# Patient Record
Sex: Male | Born: 1965 | Race: White | Hispanic: No | Marital: Married | Smoking: Never smoker
Health system: Southern US, Community
[De-identification: ages and names within clinical notes are randomized; demographics above are authoritative.]

## PROBLEM LIST (undated history)

## (undated) DIAGNOSIS — M1990 Unspecified osteoarthritis, unspecified site: Secondary | ICD-10-CM

## (undated) DIAGNOSIS — F32A Depression, unspecified: Secondary | ICD-10-CM

## (undated) HISTORY — PX: CERVICAL FUSION: SHX112

## (undated) HISTORY — PX: KNEE ARTHROSCOPY: SUR90

## (undated) HISTORY — PX: COLONOSCOPY W/ POLYPECTOMY: SHX1380

---

## 2020-06-22 ENCOUNTER — Emergency Department (HOSPITAL_COMMUNITY): Payer: Commercial Managed Care - PPO

## 2020-06-22 ENCOUNTER — Other Ambulatory Visit: Payer: Self-pay

## 2020-06-22 ENCOUNTER — Inpatient Hospital Stay (HOSPITAL_COMMUNITY)
Admission: EM | Admit: 2020-06-22 | Discharge: 2020-06-23 | DRG: 054 | Disposition: A | Discharge status: Home / Self Care | Payer: Commercial Managed Care - PPO | Attending: Neurosurgery | Admitting: Neurosurgery

## 2020-06-22 ENCOUNTER — Encounter (HOSPITAL_COMMUNITY): Payer: Self-pay | Admitting: Pediatrics

## 2020-06-22 DIAGNOSIS — R1310 Dysphagia, unspecified: Secondary | ICD-10-CM | POA: Diagnosis present

## 2020-06-22 DIAGNOSIS — Z20822 Contact with and (suspected) exposure to covid-19: Secondary | ICD-10-CM | POA: Diagnosis present

## 2020-06-22 DIAGNOSIS — R4702 Dysphasia: Secondary | ICD-10-CM | POA: Diagnosis present

## 2020-06-22 DIAGNOSIS — G9389 Other specified disorders of brain: Secondary | ICD-10-CM

## 2020-06-22 DIAGNOSIS — Z1389 Encounter for screening for other disorder: Secondary | ICD-10-CM

## 2020-06-22 DIAGNOSIS — R4701 Aphasia: Secondary | ICD-10-CM | POA: Diagnosis present

## 2020-06-22 DIAGNOSIS — D496 Neoplasm of unspecified behavior of brain: Principal | ICD-10-CM | POA: Diagnosis present

## 2020-06-22 DIAGNOSIS — C711 Malignant neoplasm of frontal lobe: Secondary | ICD-10-CM | POA: Diagnosis present

## 2020-06-22 DIAGNOSIS — G936 Cerebral edema: Secondary | ICD-10-CM | POA: Diagnosis present

## 2020-06-22 DIAGNOSIS — Z0189 Encounter for other specified special examinations: Secondary | ICD-10-CM

## 2020-06-22 LAB — URINALYSIS, ROUTINE W REFLEX MICROSCOPIC
Bilirubin Urine: NEGATIVE
Glucose, UA: NEGATIVE mg/dL
Hgb urine dipstick: NEGATIVE
Ketones, ur: NEGATIVE mg/dL
Leukocytes,Ua: NEGATIVE
Nitrite: NEGATIVE
Protein, ur: NEGATIVE mg/dL
Specific Gravity, Urine: 1.016 (ref 1.005–1.030)
pH: 7 (ref 5.0–8.0)

## 2020-06-22 LAB — COMPREHENSIVE METABOLIC PANEL
ALT: 19 U/L (ref 0–44)
AST: 19 U/L (ref 15–41)
Albumin: 4 g/dL (ref 3.5–5.0)
Alkaline Phosphatase: 67 U/L (ref 38–126)
Anion gap: 6 (ref 5–15)
BUN: 8 mg/dL (ref 6–20)
CO2: 27 mmol/L (ref 22–32)
Calcium: 10.3 mg/dL (ref 8.9–10.3)
Chloride: 109 mmol/L (ref 98–111)
Creatinine, Ser: 1 mg/dL (ref 0.61–1.24)
GFR calc Af Amer: >60 mL/min (ref >60)
GFR calc non Af Amer: >60 mL/min (ref >60)
Glucose, Bld: 104 mg/dL — ABNORMAL HIGH (ref 70–99)
Potassium: 4.2 mmol/L (ref 3.5–5.1)
Sodium: 142 mmol/L (ref 135–145)
Total Bilirubin: 0.7 mg/dL (ref 0.3–1.2)
Total Protein: 6.9 g/dL (ref 6.5–8.1)

## 2020-06-22 LAB — BASIC METABOLIC PANEL
Anion gap: 9 (ref 5–15)
BUN: 10 mg/dL (ref 6–20)
CO2: 22 mmol/L (ref 22–32)
Calcium: 10.2 mg/dL (ref 8.9–10.3)
Chloride: 106 mmol/L (ref 98–111)
Creatinine, Ser: 0.88 mg/dL (ref 0.61–1.24)
GFR calc Af Amer: >60 mL/min (ref >60)
GFR calc non Af Amer: >60 mL/min (ref >60)
Glucose, Bld: 186 mg/dL — ABNORMAL HIGH (ref 70–99)
Potassium: 5.1 mmol/L (ref 3.5–5.1)
Sodium: 137 mmol/L (ref 135–145)

## 2020-06-22 LAB — PROTIME-INR
INR: 1 (ref 0.8–1.2)
INR: 1 (ref 0.8–1.2)
Prothrombin Time: 12.3 seconds (ref 11.4–15.2)
Prothrombin Time: 12.8 seconds (ref 11.4–15.2)

## 2020-06-22 LAB — DIFFERENTIAL
Abs Immature Granulocytes: 0.03 10*3/uL (ref 0.00–0.07)
Basophils Absolute: 0 10*3/uL (ref 0.0–0.1)
Basophils Relative: 0 %
Eosinophils Absolute: 0.1 10*3/uL (ref 0.0–0.5)
Eosinophils Relative: 1 %
Immature Granulocytes: 0 %
Lymphocytes Relative: 27 %
Lymphs Abs: 2.3 10*3/uL (ref 0.7–4.0)
Monocytes Absolute: 0.7 10*3/uL (ref 0.1–1.0)
Monocytes Relative: 9 %
Neutro Abs: 5.2 10*3/uL (ref 1.7–7.7)
Neutrophils Relative %: 63 %

## 2020-06-22 LAB — RAPID URINE DRUG SCREEN, HOSP PERFORMED
Amphetamines: NOT DETECTED
Barbiturates: NOT DETECTED
Benzodiazepines: NOT DETECTED
Cocaine: NOT DETECTED
Opiates: NOT DETECTED
Tetrahydrocannabinol: NOT DETECTED

## 2020-06-22 LAB — CBC
HCT: 44.1 % (ref 39.0–52.0)
HCT: 44.1 % (ref 39.0–52.0)
Hemoglobin: 14.6 g/dL (ref 13.0–17.0)
Hemoglobin: 14.9 g/dL (ref 13.0–17.0)
MCH: 30.3 pg (ref 26.0–34.0)
MCH: 30 pg (ref 26.0–34.0)
MCHC: 33.1 g/dL (ref 30.0–36.0)
MCHC: 33.8 g/dL (ref 30.0–36.0)
MCV: 91.5 fL (ref 80.0–100.0)
MCV: 88.7 fL (ref 80.0–100.0)
Platelets: 343 10*3/uL (ref 150–400)
Platelets: 328 10*3/uL (ref 150–400)
RBC: 4.82 MIL/uL (ref 4.22–5.81)
RBC: 4.97 MIL/uL (ref 4.22–5.81)
RDW: 13 % (ref 11.5–15.5)
RDW: 12.9 % (ref 11.5–15.5)
WBC: 8.3 10*3/uL (ref 4.0–10.5)
WBC: 7.2 10*3/uL (ref 4.0–10.5)
nRBC: 0 % (ref 0.0–0.2)
nRBC: 0 % (ref 0.0–0.2)

## 2020-06-22 LAB — CBG MONITORING, ED: Glucose-Capillary: 101 mg/dL — ABNORMAL HIGH (ref 70–99)

## 2020-06-22 LAB — SARS CORONAVIRUS 2 BY RT PCR (HOSPITAL ORDER, PERFORMED IN ~~LOC~~ HOSPITAL LAB): SARS Coronavirus 2: NEGATIVE

## 2020-06-22 LAB — ETHANOL: Alcohol, Ethyl (B): <10 mg/dL (ref <10)

## 2020-06-22 LAB — APTT: aPTT: 29 seconds (ref 24–36)

## 2020-06-22 MED ORDER — ONDANSETRON HCL 4 MG PO TABS: 4.0000 mg | ORAL_TABLET | Freq: Four times a day (QID) | ORAL | Status: DC | PRN

## 2020-06-22 MED ORDER — LEVETIRACETAM IN NACL 500 MG/100ML IV SOLN
500.0000 mg | Freq: Two times a day (BID) | INTRAVENOUS | Status: DC
  Administered 2020-06-22: 500 mg via INTRAVENOUS
  Filled 2020-06-22 (×2): qty 100

## 2020-06-22 MED ORDER — SODIUM CHLORIDE 0.9% FLUSH: 3.0000 mL | INTRAVENOUS | Status: DC | PRN

## 2020-06-22 MED ORDER — LEVETIRACETAM 500 MG PO TABS
500.0000 mg | ORAL_TABLET | Freq: Two times a day (BID) | ORAL | Status: DC
  Administered 2020-06-22 – 2020-06-23 (×2): 500 mg via ORAL
  Filled 2020-06-22 (×3): qty 1

## 2020-06-22 MED ORDER — ACETAMINOPHEN 650 MG RE SUPP: 650.0000 mg | Freq: Four times a day (QID) | RECTAL | Status: DC | PRN

## 2020-06-22 MED ORDER — DOCUSATE SODIUM 100 MG PO CAPS
100.0000 mg | ORAL_CAPSULE | Freq: Two times a day (BID) | ORAL | Status: DC
  Administered 2020-06-22 – 2020-06-23 (×2): 100 mg via ORAL
  Filled 2020-06-22 (×2): qty 1

## 2020-06-22 MED ORDER — DEXAMETHASONE SODIUM PHOSPHATE 10 MG/ML IJ SOLN
20.0000 mg | Freq: Once | INTRAMUSCULAR | Status: AC
  Administered 2020-06-22: 20 mg via INTRAVENOUS
  Filled 2020-06-22: qty 2

## 2020-06-22 MED ORDER — ACETAMINOPHEN 325 MG PO TABS
650.0000 mg | ORAL_TABLET | Freq: Once | ORAL | Status: AC
  Administered 2020-06-22: 650 mg via ORAL
  Filled 2020-06-22: qty 2

## 2020-06-22 MED ORDER — DEXAMETHASONE SODIUM PHOSPHATE 10 MG/ML IJ SOLN
10.0000 mg | Freq: Four times a day (QID) | INTRAMUSCULAR | Status: DC
  Administered 2020-06-22 – 2020-06-23 (×5): 10 mg via INTRAVENOUS
  Filled 2020-06-22 (×5): qty 1

## 2020-06-22 MED ORDER — PANTOPRAZOLE SODIUM 40 MG PO TBEC
40.0000 mg | DELAYED_RELEASE_TABLET | Freq: Two times a day (BID) | ORAL | Status: DC
  Administered 2020-06-22 – 2020-06-23 (×2): 40 mg via ORAL
  Filled 2020-06-22 (×2): qty 1

## 2020-06-22 MED ORDER — SODIUM CHLORIDE 0.9 % IV SOLN: 250.0000 mL | INTRAVENOUS | Status: DC | PRN

## 2020-06-22 MED ORDER — ONDANSETRON HCL 4 MG/2ML IJ SOLN: 4.0000 mg | Freq: Four times a day (QID) | INTRAMUSCULAR | Status: DC | PRN

## 2020-06-22 MED ORDER — FLUOXETINE HCL 20 MG PO CAPS
40.0000 mg | ORAL_CAPSULE | Freq: Every day | ORAL | Status: DC
  Administered 2020-06-22 – 2020-06-23 (×2): 40 mg via ORAL
  Filled 2020-06-22 (×2): qty 2

## 2020-06-22 MED ORDER — HYDROCODONE-ACETAMINOPHEN 5-325 MG PO TABS: 1.0000 | ORAL_TABLET | ORAL | Status: DC | PRN

## 2020-06-22 MED ORDER — SODIUM CHLORIDE 0.9% FLUSH
3.0000 mL | Freq: Once | INTRAVENOUS | Status: DC
Start: 2020-06-22 — End: 2020-06-23

## 2020-06-22 MED ORDER — ACETAMINOPHEN 325 MG PO TABS
650.0000 mg | ORAL_TABLET | Freq: Four times a day (QID) | ORAL | Status: DC | PRN
  Administered 2020-06-23: 650 mg via ORAL
  Filled 2020-06-22: qty 2

## 2020-06-22 MED ORDER — GADOBUTROL 1 MMOL/ML IV SOLN
9.5000 mL | Freq: Once | INTRAVENOUS | Status: AC | PRN
  Administered 2020-06-22: 9.5 mL via INTRAVENOUS

## 2020-06-22 MED ORDER — SODIUM CHLORIDE 0.9% FLUSH
3.0000 mL | Freq: Two times a day (BID) | INTRAVENOUS | Status: DC
  Administered 2020-06-22: 3 mL via INTRAVENOUS

## 2020-06-22 NOTE — Consult Note (Signed)
Providing Compassionate, Quality Care - Together   Reason for Consult: Brain lesion Referring Physician: Dr. Bella Kennedy Robert Espinoza is an 54 y.o. male.  HPI: Patient presented to the emergency department this morning due to increased difficulty with speech. He tells me he has noticed gradual changes in his speech over the last several weeks. His symptoms have worsened significantly over the last ten days. He denies any numbness, weakness, or seizure activity. He denies any slurred speech. He denies headache or changes in vision. He reports he has been having increased difficulty with word finding. He nods and shakes his head appropriately when asked yes or no questions. He has not been to another provider regarding these symptoms. CT scan reveals a large region of vasogenic edema within the mid to anterior left frontal lobe. Centrally within this region of edema, there is an apparent underlying mass measuring 3.8 cm. Patient was unable to tell me if he has a history of cancer. His wife was not at the bedside at the time of the assessment.  History reviewed. No pertinent past medical history.  History reviewed. No pertinent surgical history.  No family history on file.  Social History:  has no history on file for tobacco use, alcohol use, and drug use.  Allergies: No Known Allergies  Medications: I have reviewed the patient's current medications.  Results for orders placed or performed during the hospital encounter of 06/22/20 (from the past 48 hour(s))  CBG monitoring, ED     Status: Abnormal   Collection Time: 06/22/20 10:34 AM  Result Value Ref Range   Glucose-Capillary 101 (H) 70 - 99 mg/dL    Comment: Glucose reference range applies only to samples taken after fasting for at least 8 hours.  Protime-INR     Status: None   Collection Time: 06/22/20 10:42 AM  Result Value Ref Range   Prothrombin Time 12.3 11.4 - 15.2 seconds   INR 1.0 0.8 - 1.2    Comment: (NOTE) INR goal  varies based on device and disease states. Performed at [facility], [place] [address] [place], [place] [address]   APTT     Status: None   Collection Time: 06/22/20 10:42 AM  Result Value Ref Range   aPTT 29 24 - 36 seconds    Comment: Performed at [place] [address], [place] [address]  CBC     Status: None   Collection Time: 06/22/20 10:42 AM  Result Value Ref Range   WBC 8.3 4.0 - 10.5 K/uL   RBC 4.82 4.22 - 5.81 MIL/uL   Hemoglobin 14.6 13.0 - 17.0 g/dL   HCT 44.1 39 - 52 %   MCV 91.5 80.0 - 100.0 fL   MCH 30.3 26.0 - 34.0 pg   MCHC 33.1 30.0 - 36.0 g/dL   RDW 13.0 11.5 - 15.5 %   Platelets 343 150 - 400 K/uL   nRBC 0.0 0.0 - 0.2 %    Comment: Performed at [facility], [place] [address], [place], [place] [phone]  Differential     Status: None   Collection Time: 06/22/20 10:42 AM  Result Value Ref Range   Neutrophils Relative % 63 %   Neutro Abs 5.2 1.7 - 7.7 K/uL   Lymphocytes Relative 27 %   Lymphs Abs 2.3 0.7 - 4.0 K/uL   Monocytes Relative 9 %   Monocytes Absolute 0.7 0 - 1 K/uL   Eosinophils Relative 1 %  Eosinophils Absolute 0.1 0 - 0 K/uL   Basophils Relative 0 %   Basophils Absolute 0.0 0 - 0 K/uL   Immature Granulocytes 0 %   Abs Immature Granulocytes 0.03 0.00 - 0.07 K/uL    Comment: Performed at [facility], [place] [address], [place], [place] [address]  Comprehensive metabolic panel     Status: Abnormal   Collection Time: 06/22/20 10:42 AM  Result Value Ref Range   Sodium 142 135 - 145 mmol/L   Potassium 4.2 3.5 - 5.1 mmol/L   Chloride 109 98 - 111 mmol/L   CO2 27 22 - 32 mmol/L   Glucose, Bld 104 (H) 70 - 99 mg/dL    Comment: Glucose reference range applies only to samples taken after fasting for at least 8 hours.   BUN 8 6 - 20 mg/dL   Creatinine, Ser 1.00 0.61 - 1.24 mg/dL   Calcium 10.3 8.9 - 10.3 mg/dL   Total Protein 6.9 6.5 - 8.1 g/dL   Albumin 4.0 3.5 - 5.0 g/dL   AST 19 15 - 41 U/L   ALT 19 0 - 44  U/L   Alkaline Phosphatase 67 38 - 126 U/L   Total Bilirubin 0.7 0.3 - 1.2 mg/dL   GFR calc non Af Amer >60 >60 mL/min   GFR calc Af Amer >60 >60 mL/min   Anion gap 6 5 - 15    Comment: Performed at [facility], [place] [address], [place], [place] [address]  Ethanol     Status: None   Collection Time: 06/22/20 11:44 AM  Result Value Ref Range   Alcohol, Ethyl (B) <10 <10 mg/dL    Comment: (NOTE) Lowest detectable limit for serum alcohol is 10 mg/dL.  For medical purposes only. Performed at [facility], [place] [address] [place], [place] [address]   Urine rapid drug screen (hosp performed)     Status: None   Collection Time: 06/22/20 11:47 AM  Result Value Ref Range   Opiates NONE DETECTED NONE DETECTED   Cocaine NONE DETECTED NONE DETECTED   Benzodiazepines NONE DETECTED NONE DETECTED   Amphetamines NONE DETECTED NONE DETECTED   Tetrahydrocannabinol NONE DETECTED NONE DETECTED   Barbiturates NONE DETECTED NONE DETECTED    Comment: (NOTE) DRUG SCREEN FOR MEDICAL PURPOSES ONLY.  IF CONFIRMATION IS NEEDED FOR ANY PURPOSE, NOTIFY LAB WITHIN 5 DAYS.  LOWEST DETECTABLE LIMITS FOR URINE DRUG SCREEN Drug Class                     Cutoff (ng/mL) Amphetamine and metabolites    1000 Barbiturate and metabolites    200 Benzodiazepine                 948 Tricyclics and metabolites     300 Opiates and metabolites        300 Cocaine and metabolites        300 THC                            50 Performed at [facility], [place] [address] [place], [place], [place] [address]   Urinalysis, Routine w reflex microscopic     Status: Abnormal   Collection Time: 06/22/20 11:50 AM  Result Value Ref Range   Color, Urine YELLOW YELLOW   APPearance HAZY (A) CLEAR   Specific Gravity, Urine 1.016 1.005 - 1.030   pH 7.0 5.0 - 8.0  Glucose, UA NEGATIVE NEGATIVE mg/dL   Hgb urine dipstick NEGATIVE NEGATIVE   Bilirubin Urine NEGATIVE NEGATIVE   Ketones, ur NEGATIVE NEGATIVE mg/dL    Protein, ur NEGATIVE NEGATIVE mg/dL   Nitrite NEGATIVE NEGATIVE   Leukocytes,Ua NEGATIVE NEGATIVE    Comment: Performed at [facility] [address], [place], [place] [address]    DG Chest 1 View  Result Date: 06/22/2020 CLINICAL DATA:  Altered mental status. Pre MRI. EXAM: CHEST  1 VIEW COMPARISON:  None. FINDINGS: Low lung volumes with vascular crowding and bibasilar atelectasis. The heart is normal in size. No infiltrates or effusions. No worrisome pulmonary lesions. The bony thorax is intact. No metallic foreign bodies are identified. IMPRESSION: Low lung volumes with vascular crowding and bibasilar atelectasis. Electronically Signed   By: Marijo Sanes M.D.   On: 06/22/2020 13:00   DG Abd 1 View  Result Date: 06/22/2020 CLINICAL DATA:  MRI clearance, altered mental status EXAM: ABDOMEN - 1 VIEW COMPARISON:  None FINDINGS: No metallic foreign bodies identified. Nonspecific bowel gas pattern. Osseous structures unremarkable. No urinary tract calcifications. Small pelvic phleboliths noted. IMPRESSION: No metallic foreign bodies identified. Electronically Signed   By: Lavonia Dana M.D.   On: 06/22/2020 13:05   CT HEAD WO CONTRAST  Result Date: 06/22/2020 CLINICAL DATA:  Neuro deficit, acute, stroke suspected. Additional history provided: Difficulty answering questions, expressive aphasia with unknown onset. EXAM: CT HEAD WITHOUT CONTRAST TECHNIQUE: Contiguous axial images were obtained from the base of the skull through the vertex without intravenous contrast. COMPARISON:  No pertinent prior studies available for comparison. FINDINGS: Brain: There is a large region of vasogenic edema within the mid to anterior left frontal lobe. Centrally within this region of vasogenic edema there is a probable underlying mass measuring 3.8 x 3.5 x 3.5 cm (series 3, image 23) (series 5, image 26). There is resultant mass effect with partial effacement of the anterolateral ventricles. 7 mm rightward midline  shift measured at the level of the septum pellucidum. There is rightward subfalcine herniation. There is no acute intracranial hemorrhage. No definite demarcated cortical infarct is identified. No extra-axial fluid collection. No evidence of intracranial mass. No midline shift. Vascular: No hyperdense vessel. Skull: Normal. Negative for fracture or focal lesion. Sinuses/Orbits: Visualized orbits show no acute finding. No significant paranasal sinus disease or mastoid effusion at the imaged levels. These results were called by telephone at the time of interpretation on 06/22/2020 at 12:32 pm to provider Dr. Alvino Chapel, who verbally acknowledged these results. IMPRESSION: Large region of vasogenic edema within the mid to anterior left frontal lobe. Centrally within this region of edema, there is an apparent underlying mass measuring 3.8 cm. Primary differential considerations include metastatic disease versus primary CNS neoplasm. Contrast-enhanced brain MRI is recommended for further characterization. Associated mass effect with partial effacement of the ventricular system, 7 mm rightward midline shift and rightward subfalcine herniation. Electronically Signed   By: Kellie Simmering DO   On: 06/22/2020 12:35    Review of Systems  Unable to perform ROS: Mental status change  Patient reports that the change in speech has been the only notable  Blood pressure (!) 147/93, pulse 66, temperature 98.6 F (37 C), temperature source Oral, resp. rate 10, height 5\' 11"  (1.803 m), weight 97.5 kg, SpO2 91 %. Physical Exam Constitutional:      Appearance: Normal appearance.  HENT:     Head: Normocephalic and atraumatic.     Nose: Nose normal.  Mouth/Throat:     Mouth: Mucous membranes are moist.  Eyes:     Extraocular Movements: Extraocular movements intact.     Conjunctiva/sclera: Conjunctivae normal.     Pupils: Pupils are equal, round, and reactive to light.  Cardiovascular:     Rate and Rhythm: Normal rate  and regular rhythm.     Pulses: Normal pulses.  Pulmonary:     Effort: Pulmonary effort is normal. No respiratory distress.  Abdominal:     General: Abdomen is flat.     Palpations: Abdomen is soft.  Musculoskeletal:        General: Normal range of motion.     Cervical back: Normal range of motion and neck supple.  Skin:    General: Skin is warm and dry.  Neurological:     General: No focal deficit present.     Mental Status: He is alert.     GCS: GCS eye subscore is 4. GCS verbal subscore is 3. GCS motor subscore is 6.     Cranial Nerves: Cranial nerves are intact.     Sensory: Sensation is intact.     Motor: Motor function is intact.     Coordination: Coordination is intact.     Gait: Gait is intact.     Comments: Moderate to severe expressive aphasia Oriented to person, place, and year  Psychiatric:        Attention and Perception: Attention normal.        Mood and Affect: Mood normal.        Behavior: Behavior normal.     Assessment/Plan: Patient presented to the ED this morning with speech difficulty. CT scan revealed a left frontal mass. MRI with [facility] protocol has been ordered. Further work up needed to determine if this is a primary lesion or metastasis. Decadron and Keppra ordered. Plan of care will be established once MRI is complete.  Patricia Nettle 06/22/2020, 3:09 PM

## 2020-06-22 NOTE — ED Notes (Signed)
Pt returned from MRI °

## 2020-06-22 NOTE — ED Provider Notes (Signed)
[facility] EMERGENCY DEPARTMENT Provider Note   CSN: [id number] Arrival date & time: 06/22/20  1034  LEVEL 5 CAVEAT - TROUBLE SPEAKING History Chief Complaint  Patient presents with  . Aphasia    Robert Espinoza is a 54 y.o. male.  HPI 54 year old male presents via EMS for trouble speaking.  Last known well is hard to come up with and seems to be longer than today.  The history from the patient, is very difficult as he is unable to get all the correct words out.  Sometimes when asking question he just cannot come up with a word to answer.  However he does tell me he has not had a headache or focal weakness.  He thinks this has been going on since "Thursday".  However he also cannot tell me what today is but does know that it is 2021.  History reviewed. No pertinent past medical history.  There are no problems to display for this patient.   History reviewed. No pertinent surgical history.     No family history on file.  Social History   Tobacco Use  . Smoking status: Not on file  Substance Use Topics  . Alcohol use: Not on file  . Drug use: Not on file    Home Medications Prior to Admission medications   Not on File    Allergies    Patient has no known allergies.  Review of Systems   Review of Systems  Unable to perform ROS: Other    Physical Exam Updated Vital Signs BP (!) 147/93   Pulse 66   Temp 98.6 F (37 C) (Oral)   Resp 10   Ht 5\' 11"  (1.803 m)   Wt 97.5 kg   SpO2 91%   BMI 29.99 kg/m   Physical Exam Vitals and nursing note reviewed.  Constitutional:      General: He is not in acute distress.    Appearance: He is well-developed. He is not ill-appearing or diaphoretic.  HENT:     Head: Normocephalic and atraumatic.     Right Ear: External ear normal.     Left Ear: External ear normal.     Nose: Nose normal.  Eyes:     General:        Right eye: No discharge.        Left eye: No discharge.  Cardiovascular:     Rate  and Rhythm: Normal rate and regular rhythm.     Heart sounds: Normal heart sounds.  Pulmonary:     Effort: Pulmonary effort is normal.     Breath sounds: Normal breath sounds.  Abdominal:     Palpations: Abdomen is soft.     Tenderness: There is no abdominal tenderness.  Musculoskeletal:     Cervical back: Neck supple.  Skin:    General: Skin is warm and dry.  Neurological:     Mental Status: He is alert.     Comments: Awake, alert, no slurred speech. CN 3-12 grossly intact. 5/5 strength in all 4 extremities. Grossly normal sensation. Normal finger to nose. Cannot articulate everything he wants to say. He is able to identify a pen and a watch without difficulty.  Psychiatric:        Mood and Affect: Mood is not anxious.     ED Results / Procedures / Treatments   Labs (all labs ordered are listed, but only abnormal results are displayed) Labs Reviewed  COMPREHENSIVE METABOLIC PANEL - Abnormal; Notable for the following  components:      Result Value   Glucose, Bld 104 (*)    All other components within normal limits  URINALYSIS, ROUTINE W REFLEX MICROSCOPIC - Abnormal; Notable for the following components:   APPearance HAZY (*)    All other components within normal limits  CBG MONITORING, ED - Abnormal; Notable for the following components:   Glucose-Capillary 101 (*)    All other components within normal limits  SARS CORONAVIRUS 2 BY RT PCR (HOSPITAL ORDER, [place] [facility])  PROTIME-INR  APTT  CBC  DIFFERENTIAL  ETHANOL  RAPID URINE DRUG SCREEN, HOSP PERFORMED  I-STAT CHEM 8, ED  CBG MONITORING, ED    EKG EKG Interpretation  Date/Time:  Wednesday June 22 2020 10:37:55 EDT Ventricular Rate:  69 PR Interval:    QRS Duration: 99 QT Interval:  371 QTC Calculation: 398 R Axis:   -11 Text Interpretation: Sinus rhythm no acute ST/T changes No old tracing to compare Confirmed by Sherwood Gambler [phone]) on 06/22/2020 11:23:02 AM   Radiology DG  Chest 1 View  Result Date: 06/22/2020 CLINICAL DATA:  Altered mental status. Pre MRI. EXAM: CHEST  1 VIEW COMPARISON:  None. FINDINGS: Low lung volumes with vascular crowding and bibasilar atelectasis. The heart is normal in size. No infiltrates or effusions. No worrisome pulmonary lesions. The bony thorax is intact. No metallic foreign bodies are identified. IMPRESSION: Low lung volumes with vascular crowding and bibasilar atelectasis. Electronically Signed   By: Marijo Sanes M.D.   On: 06/22/2020 13:00   DG Abd 1 View  Result Date: 06/22/2020 CLINICAL DATA:  MRI clearance, altered mental status EXAM: ABDOMEN - 1 VIEW COMPARISON:  None FINDINGS: No metallic foreign bodies identified. Nonspecific bowel gas pattern. Osseous structures unremarkable. No urinary tract calcifications. Small pelvic phleboliths noted. IMPRESSION: No metallic foreign bodies identified. Electronically Signed   By: Lavonia Dana M.D.   On: 06/22/2020 13:05   CT HEAD WO CONTRAST  Result Date: 06/22/2020 CLINICAL DATA:  Neuro deficit, acute, stroke suspected. Additional history provided: Difficulty answering questions, expressive aphasia with unknown onset. EXAM: CT HEAD WITHOUT CONTRAST TECHNIQUE: Contiguous axial images were obtained from the base of the skull through the vertex without intravenous contrast. COMPARISON:  No pertinent prior studies available for comparison. FINDINGS: Brain: There is a large region of vasogenic edema within the mid to anterior left frontal lobe. Centrally within this region of vasogenic edema there is a probable underlying mass measuring 3.8 x 3.5 x 3.5 cm (series 3, image 23) (series 5, image 26). There is resultant mass effect with partial effacement of the anterolateral ventricles. 7 mm rightward midline shift measured at the level of the septum pellucidum. There is rightward subfalcine herniation. There is no acute intracranial hemorrhage. No definite demarcated cortical infarct is identified. No  extra-axial fluid collection. No evidence of intracranial mass. No midline shift. Vascular: No hyperdense vessel. Skull: Normal. Negative for fracture or focal lesion. Sinuses/Orbits: Visualized orbits show no acute finding. No significant paranasal sinus disease or mastoid effusion at the imaged levels. These results were called by telephone at the time of interpretation on 06/22/2020 at 12:32 pm to provider Dr. Alvino Chapel, who verbally acknowledged these results. IMPRESSION: Large region of vasogenic edema within the mid to anterior left frontal lobe. Centrally within this region of edema, there is an apparent underlying mass measuring 3.8 cm. Primary differential considerations include metastatic disease versus primary CNS neoplasm. Contrast-enhanced brain MRI is recommended for further characterization. Associated mass effect  with partial effacement of the ventricular system, 7 mm rightward midline shift and rightward subfalcine herniation. Electronically Signed   By: Kellie Simmering DO   On: 06/22/2020 12:35    Procedures .Critical Care Performed by: Sherwood Gambler, MD Authorized by: Sherwood Gambler, MD   Critical care provider statement:    Critical care time (minutes):  30   Critical care time was exclusive of:  Separately billable procedures and treating other patients   Critical care was necessary to treat or prevent imminent or life-threatening deterioration of the following conditions:  CNS failure or compromise   Critical care was time spent personally by me on the following activities:  Discussions with consultants, evaluation of patient's response to treatment, examination of patient, ordering and performing treatments and interventions, ordering and review of laboratory studies, ordering and review of radiographic studies, pulse oximetry, re-evaluation of patient's condition, obtaining history from patient or surrogate and review of old charts   (including critical care time)  Medications  Ordered in ED Medications  sodium chloride flush (NS) 0.9 % injection 3 mL (has no administration in time range)  dexamethasone (DECADRON) injection 10 mg (has no administration in time range)  levETIRAcetam (KEPPRA) IVPB 500 mg/100 mL premix (0 mg Intravenous Stopped 06/22/20 1418)  dexamethasone (DECADRON) injection 20 mg (20 mg Intravenous Given 06/22/20 1259)    ED Course  I have reviewed the triage vital signs and the nursing notes.  Pertinent labs & imaging results that were available during my care of the patient were reviewed by me and considered in my medical decision making (see chart for details).    MDM Rules/Calculators/A&P                          Patient CT unfortunately shows what is likely an underlying mass with edema.  Besides the trouble speaking he has no focal deficits.  He is protecting his airway.  I discussed with Viona Gilmore, who has ordered the MRI and ordered Decadron.  Will need to touch base with neurosurgery again after MRI. Final Clinical Impression(s) / ED Diagnoses Final diagnoses:  Brain mass    Rx / DC Orders ED Discharge Orders    None       Sherwood Gambler, MD 06/22/20 1512

## 2020-06-22 NOTE — Progress Notes (Signed)
Subjective: The patient is alert and pleasant.  He has an expressive aphasia.  His wife is at the bedside.  Objective: Vital signs in last 24 hours: Temp:  [98.6 F (37 C)-98.8 F (37.1 C)] 98.6 F (37 C) (06/30 1716) Pulse Rate:  [59-77] 67 (06/30 1930) Resp:  [10-20] 13 (06/30 1930) BP: (124-147)/(86-101) 125/88 (06/30 1930) SpO2:  [91 %-98 %] 95 % (06/30 1930) Weight:  [97.5 kg] 97.5 kg (06/30 1039) Estimated body mass index is 29.99 kg/m as calculated from the following:   Height as of this encounter: 5\' 11"  (1.803 m).   Weight as of this encounter: 97.5 kg.   Intake/Output from previous day: No intake/output data recorded. Intake/Output this shift: No intake/output data recorded.  Physical exam the patient is alert and pleasant.  He can answer questions appropriately.  His speech is clear but he has an expressive aphasia/dysphasia.  I have reviewed the patient's head CT and brain MRI which demonstrates an enhancing left frontal intraparenchymal lesion with moderate surrounding edema.  Lab Results: Recent Labs    06/22/20 1042  WBC 8.3  HGB 14.6  HCT 44.1  PLT 343   BMET Recent Labs    06/22/20 1042  NA 142  K 4.2  CL 109  CO2 27  GLUCOSE 104*  BUN 8  CREATININE 1.00  CALCIUM 10.3    Studies/Results: DG Chest 1 View  Result Date: 06/22/2020 CLINICAL DATA:  Altered mental status. Pre MRI. EXAM: CHEST  1 VIEW COMPARISON:  None. FINDINGS: Low lung volumes with vascular crowding and bibasilar atelectasis. The heart is normal in size. No infiltrates or effusions. No worrisome pulmonary lesions. The bony thorax is intact. No metallic foreign bodies are identified. IMPRESSION: Low lung volumes with vascular crowding and bibasilar atelectasis. Electronically Signed   By: Marijo Sanes M.D.   On: 06/22/2020 13:00   DG Abd 1 View  Result Date: 06/22/2020 CLINICAL DATA:  MRI clearance, altered mental status EXAM: ABDOMEN - 1 VIEW COMPARISON:  None FINDINGS: No  metallic foreign bodies identified. Nonspecific bowel gas pattern. Osseous structures unremarkable. No urinary tract calcifications. Small pelvic phleboliths noted. IMPRESSION: No metallic foreign bodies identified. Electronically Signed   By: Lavonia Dana M.D.   On: 06/22/2020 13:05   CT HEAD WO CONTRAST  Result Date: 06/22/2020 CLINICAL DATA:  Neuro deficit, acute, stroke suspected. Additional history provided: Difficulty answering questions, expressive aphasia with unknown onset. EXAM: CT HEAD WITHOUT CONTRAST TECHNIQUE: Contiguous axial images were obtained from the base of the skull through the vertex without intravenous contrast. COMPARISON:  No pertinent prior studies available for comparison. FINDINGS: Brain: There is a large region of vasogenic edema within the mid to anterior left frontal lobe. Centrally within this region of vasogenic edema there is a probable underlying mass measuring 3.8 x 3.5 x 3.5 cm (series 3, image 23) (series 5, image 26). There is resultant mass effect with partial effacement of the anterolateral ventricles. 7 mm rightward midline shift measured at the level of the septum pellucidum. There is rightward subfalcine herniation. There is no acute intracranial hemorrhage. No definite demarcated cortical infarct is identified. No extra-axial fluid collection. No evidence of intracranial mass. No midline shift. Vascular: No hyperdense vessel. Skull: Normal. Negative for fracture or focal lesion. Sinuses/Orbits: Visualized orbits show no acute finding. No significant paranasal sinus disease or mastoid effusion at the imaged levels. These results were called by telephone at the time of interpretation on 06/22/2020 at 12:32 pm to provider Dr. Alvino Chapel,  who verbally acknowledged these results. IMPRESSION: Large region of vasogenic edema within the mid to anterior left frontal lobe. Centrally within this region of edema, there is an apparent underlying mass measuring 3.8 cm. Primary  differential considerations include metastatic disease versus primary CNS neoplasm. Contrast-enhanced brain MRI is recommended for further characterization. Associated mass effect with partial effacement of the ventricular system, 7 mm rightward midline shift and rightward subfalcine herniation. Electronically Signed   By: Kellie Simmering DO   On: 06/22/2020 12:35   MR Brain W and Wo Contrast  Result Date: 06/22/2020 CLINICAL DATA:  Expressive aphasia.  Abnormal head CT. EXAM: MRI HEAD WITHOUT AND WITH CONTRAST TECHNIQUE: Multiplanar, multiecho pulse sequences of the brain and surrounding structures were obtained without and with intravenous contrast. CONTRAST:  9.41mL GADAVIST GADOBUTROL 1 MMOL/ML IV SOLN COMPARISON:  Head CT same day. FINDINGS: Brain: Brainstem and cerebellum are normal. Within the left frontal lobe, there is a mass lesion with central necrosis measuring 4.5 cm in diameter. Considerable regional vasogenic edema. Mass effect with left-to-right shift of 9 mm. There is only mild vasogenic edema crossing the midline at the region of the anterior corpus callosum. This lesion could be a malignant primary brain tumor or a metastatic lesion. No second tumor is identified however, which would favor primary brain neoplasm given the size of the lesion. Low level blood products are present within the tumor. Vascular: Major vessels at the base of the brain show flow. Skull and upper cervical spine: Negative Sinuses/Orbits: Clear/normal Other: None IMPRESSION: 4.5 cm in diameter necrotic mass in the left frontal lobe with pronounced regional vasogenic edema. Mass effect with left-to-right shift of 8-9 mm. Low level blood products present within the necrotic tumor. 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 Internal material does not show enough restricted diffusion to suggest brain abscess. Electronically Signed   By: Nelson Chimes M.D.    On: 06/22/2020 16:52    Assessment/Plan: Left frontal brain tumor, aphasia: I have discussed the situation with the patient and his wife.  We have discussed the various possibilities, with tumor being the most likely.  We discussed metastatic versus primary tumors.  We have discussed the various treatment options including doing nothing, empiric treatment, and surgery.  I have described the surgical treatment options of a stereotactic brain biopsy versus craniotomy for resection of the tumor.  I favor the latter.  I described the surgery to them.  We have discussed the risks of surgery include the risks of anesthesia, hemorrhage, infection, bleeding, incomplete tumor resection, recurrent tumor growth, medical risk, seizures, worsening aphasia, etc.  I have answered all their questions.  I have told him I favor a few days of IV steroids and planning to do the surgery sometime next week.  LOS: 0 days     Ophelia Charter 06/22/2020, 7:50 PM

## 2020-06-22 NOTE — ED Triage Notes (Signed)
Patient arrived via ems; c/o expressive aphasia w/ unknown onset. Reported was at work this morning when a co-worker noticed the deficits. Patient is usually able communicate normal.   On arrival to ED, patient unable to state name, follows commands. patient is able to repeat "mama, tip top, thanks, and huckleberry" without slurring.

## 2020-06-22 NOTE — ED Notes (Signed)
Pt resting in bed, no needs expressed at this time. Pt given sandwich bag. Will continue to monitor.

## 2020-06-22 NOTE — ED Notes (Signed)
Pt otf to MRI.

## 2020-06-23 ENCOUNTER — Other Ambulatory Visit: Payer: Self-pay | Admitting: Neurosurgery

## 2020-06-23 LAB — HIV ANTIBODY (ROUTINE TESTING W REFLEX): HIV Screen 4th Generation wRfx: NONREACTIVE

## 2020-06-23 MED ORDER — PANTOPRAZOLE SODIUM 40 MG PO TBEC: 40.0000 mg | DELAYED_RELEASE_TABLET | Freq: Two times a day (BID) | ORAL | 3 refills | Status: DC

## 2020-06-23 MED ORDER — LEVETIRACETAM 500 MG PO TABS: 500.0000 mg | ORAL_TABLET | Freq: Two times a day (BID) | ORAL | 3 refills | Status: DC

## 2020-06-23 MED ORDER — DEXAMETHASONE 4 MG PO TABS
4.0000 mg | ORAL_TABLET | Freq: Three times a day (TID) | ORAL | 2 refills | Status: DC
Start: 2020-06-23 — End: 2021-03-26

## 2020-06-23 NOTE — ED Notes (Signed)
Pt is at the desk trying to leave AMA. Pt was told that we would like him to stay. [place] paged provider to let him know.

## 2020-06-23 NOTE — ED Notes (Signed)
SDU Breakfast Ordered 

## 2020-06-23 NOTE — Discharge Summary (Signed)
Physician Discharge Summary  Patient ID: Robert Espinoza MRN: [id number] DOB/AGE: 1966/09/18 54 y.o.  Admit date: 06/22/2020 Discharge date: 06/23/2020  Admission Diagnoses: Left frontal brain tumor, speech difficulty  Discharge Diagnoses: Left frontal brain tumor, dysphasia Active Problems:   Brain tumor [facility])   Discharged Condition: fair  Hospital Course: Patient was admitted through the emergency department having had an MRI for evaluation of difficulty speaking.  Is found to have a large left frontal brain tumor.  There is substantial vasogenic edema.  Patient was started on Decadron.  His sensorium improved and his speech improved substantially.  Surgery has been scheduled for next week to remove this left frontal brain tumor but in the meantime patient is discharged home on Decadron and Keppra.  Arrangements are being made for readmission next Wednesday.  Consults: None  Significant Diagnostic Studies: MRI  Treatments: Observation  Discharge Exam: Blood pressure 125/82, pulse 63, temperature 98.6 F (37 C), temperature source Oral, resp. rate 12, height 5\' 11"  (1.803 m), weight 97.5 kg, SpO2 95 %. Dysphasia markedly improved on Decadron.  Patient wishes to be discharged home Station and gait are intact  Disposition: Discharge disposition: 01-Home or Self Care       Discharge Instructions    Call MD for:  persistant nausea and vomiting   Complete by: As directed    Call MD for:  severe uncontrolled pain   Complete by: As directed    Call MD for:  temperature >100.4   Complete by: As directed    Diet - low sodium heart healthy   Complete by: As directed    Increase activity slowly   Complete by: As directed      Allergies as of 06/23/2020   No Known Allergies     Medication List    TAKE these medications   amphetamine-dextroamphetamine 20 MG tablet Commonly known as: ADDERALL Take 20 mg by mouth every morning.   dexamethasone 4 MG tablet Commonly known as:  DECADRON Take 1 tablet (4 mg total) by mouth 3 (three) times daily.   FLUoxetine 40 MG capsule Commonly known as: PROZAC Take 40 mg by mouth daily.   levETIRAcetam 500 MG tablet Commonly known as: KEPPRA Take 1 tablet (500 mg total) by mouth 2 (two) times daily.   pantoprazole 40 MG tablet Commonly known as: PROTONIX Take 1 tablet (40 mg total) by mouth 2 (two) times daily.   Tart Cherry Advanced Caps Take 1 capsule by mouth daily.        Signed: Earleen Newport 06/23/2020, 5:57 PM

## 2020-06-23 NOTE — ED Notes (Signed)
Pt resting comfortably with no acute distress noted at this time. Call bell in reach.

## 2020-06-23 NOTE — ED Notes (Signed)
Spoke with MD and patient is okay to be discharge

## 2020-06-23 NOTE — Progress Notes (Signed)
Subjective: The patient is alert and pleasant.  His speech is a bit better today.  He has no complaints.  Objective: Vital signs in last 24 hours: Temp:  [98.6 F (37 C)-98.8 F (37.1 C)] 98.6 F (37 C) (06/30 1716) Pulse Rate:  [52-77] 72 (07/01 0713) Resp:  [10-20] 15 (07/01 0713) BP: (102-147)/(58-101) 122/74 (07/01 0713) SpO2:  [91 %-99 %] 93 % (07/01 0713) Weight:  [97.5 kg] 97.5 kg (06/30 1039) Estimated body mass index is 29.99 kg/m as calculated from the following:   Height as of this encounter: 5\' 11"  (1.803 m).   Weight as of this encounter: 97.5 kg.   Intake/Output from previous day: No intake/output data recorded. Intake/Output this shift: No intake/output data recorded.  Physical exam the patient is alert and pleasant.  He is moving all 4 extremities well.  His expressive dysphagia has improved.  Lab Results: Recent Labs    06/22/20 1042 06/22/20 2200  WBC 8.3 7.2  HGB 14.6 14.9  HCT 44.1 44.1  PLT 343 328   BMET Recent Labs    06/22/20 1042 06/22/20 2200  NA 142 137  K 4.2 5.1  CL 109 106  CO2 27 22  GLUCOSE 104* 186*  BUN 8 10  CREATININE 1.00 0.88  CALCIUM 10.3 10.2    Studies/Results: DG Chest 1 View  Result Date: 06/22/2020 CLINICAL DATA:  Altered mental status. Pre MRI. EXAM: CHEST  1 VIEW COMPARISON:  None. FINDINGS: Low lung volumes with vascular crowding and bibasilar atelectasis. The heart is normal in size. No infiltrates or effusions. No worrisome pulmonary lesions. The bony thorax is intact. No metallic foreign bodies are identified. IMPRESSION: Low lung volumes with vascular crowding and bibasilar atelectasis. Electronically Signed   By: Marijo Sanes M.D.   On: 06/22/2020 13:00   DG Abd 1 View  Result Date: 06/22/2020 CLINICAL DATA:  MRI clearance, altered mental status EXAM: ABDOMEN - 1 VIEW COMPARISON:  None FINDINGS: No metallic foreign bodies identified. Nonspecific bowel gas pattern. Osseous structures unremarkable. No  urinary tract calcifications. Small pelvic phleboliths noted. IMPRESSION: No metallic foreign bodies identified. Electronically Signed   By: Lavonia Dana M.D.   On: 06/22/2020 13:05   CT HEAD WO CONTRAST  Result Date: 06/22/2020 CLINICAL DATA:  Neuro deficit, acute, stroke suspected. Additional history provided: Difficulty answering questions, expressive aphasia with unknown onset. EXAM: CT HEAD WITHOUT CONTRAST TECHNIQUE: Contiguous axial images were obtained from the base of the skull through the vertex without intravenous contrast. COMPARISON:  No pertinent prior studies available for comparison. FINDINGS: Brain: There is a large region of vasogenic edema within the mid to anterior left frontal lobe. Centrally within this region of vasogenic edema there is a probable underlying mass measuring 3.8 x 3.5 x 3.5 cm (series 3, image 23) (series 5, image 26). There is resultant mass effect with partial effacement of the anterolateral ventricles. 7 mm rightward midline shift measured at the level of the septum pellucidum. There is rightward subfalcine herniation. There is no acute intracranial hemorrhage. No definite demarcated cortical infarct is identified. No extra-axial fluid collection. No evidence of intracranial mass. No midline shift. Vascular: No hyperdense vessel. Skull: Normal. Negative for fracture or focal lesion. Sinuses/Orbits: Visualized orbits show no acute finding. No significant paranasal sinus disease or mastoid effusion at the imaged levels. These results were called by telephone at the time of interpretation on 06/22/2020 at 12:32 pm to provider Dr. Alvino Chapel, who verbally acknowledged these results. IMPRESSION: Large region of vasogenic  edema within the mid to anterior left frontal lobe. Centrally within this region of edema, there is an apparent underlying mass measuring 3.8 cm. Primary differential considerations include metastatic disease versus primary CNS neoplasm. Contrast-enhanced brain  MRI is recommended for further characterization. Associated mass effect with partial effacement of the ventricular system, 7 mm rightward midline shift and rightward subfalcine herniation. Electronically Signed   By: Kellie Simmering DO   On: 06/22/2020 12:35   MR Brain W and Wo Contrast  Result Date: 06/22/2020 CLINICAL DATA:  Expressive aphasia.  Abnormal head CT. EXAM: MRI HEAD WITHOUT AND WITH CONTRAST TECHNIQUE: Multiplanar, multiecho pulse sequences of the brain and surrounding structures were obtained without and with intravenous contrast. CONTRAST:  9.48mL GADAVIST GADOBUTROL 1 MMOL/ML IV SOLN COMPARISON:  Head CT same day. FINDINGS: Brain: Brainstem and cerebellum are normal. Within the left frontal lobe, there is a mass lesion with central necrosis measuring 4.5 cm in diameter. Considerable regional vasogenic edema. Mass effect with left-to-right shift of 9 mm. There is only mild vasogenic edema crossing the midline at the region of the anterior corpus callosum. This lesion could be a malignant primary brain tumor or a metastatic lesion. No second tumor is identified however, which would favor primary brain neoplasm given the size of the lesion. Low level blood products are present within the tumor. Vascular: Major vessels at the base of the brain show flow. Skull and upper cervical spine: Negative Sinuses/Orbits: Clear/normal Other: None IMPRESSION: 4.5 cm in diameter necrotic mass in the left frontal lobe with pronounced regional vasogenic edema. Mass effect with left-to-right shift of 8-9 mm. Low level blood products present within the necrotic tumor. 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 Internal material does not show enough restricted diffusion to suggest brain abscess. Electronically Signed   By: Nelson Chimes M.D.   On: 06/22/2020 16:52    Assessment/Plan: Left frontal brain tumor: The patient seems to be responding to  steroids.  The plan is to proceed with a stereotactic craniotomy on 06/29/2020.  We have discussed that if he continues to improve on steroids, he can be discharged on oral steroids and return next Wednesday for surgery.  I have answered all his questions.  LOS: 1 day     Ophelia Charter 06/23/2020, 9:18 AM

## 2020-06-23 NOTE — ED Notes (Signed)
Pt ambulated self to pt restroom and back to treatment area with steady gait. Pt denies any wants or needs at this time, pt lying in bed at this time. Denies any wants or needs, call bell in reach.

## 2020-06-23 NOTE — ED Notes (Addendum)
Pt verbalized understanding d/c papework

## 2020-06-28 ENCOUNTER — Other Ambulatory Visit: Payer: Self-pay

## 2020-06-28 ENCOUNTER — Encounter (HOSPITAL_COMMUNITY): Payer: Self-pay | Admitting: Neurosurgery

## 2020-06-28 NOTE — Progress Notes (Signed)
I called Mr. Gainer's cell phone , his wife Janett Billow answered, she stated that patient is having speech issues, he can appropriately yes, no. Janett Billow answered questions with Mr. Hagedorn present, assist as needed. Speech is the only area that is effected.  Mr. Dendinger will be tested for Covid on arrival, he lives out of town and will not be in [place] until [place].

## 2020-06-29 ENCOUNTER — Encounter (HOSPITAL_COMMUNITY): Payer: Self-pay | Admitting: Neurosurgery

## 2020-06-29 ENCOUNTER — Inpatient Hospital Stay (HOSPITAL_COMMUNITY): Payer: Commercial Managed Care - PPO

## 2020-06-29 ENCOUNTER — Encounter (HOSPITAL_COMMUNITY)
Admission: RE | Disposition: A | Discharge status: Home / Self Care | Payer: Self-pay | Source: Home / Self Care | Attending: Neurosurgery

## 2020-06-29 ENCOUNTER — Inpatient Hospital Stay (HOSPITAL_COMMUNITY)
Admission: RE | Admit: 2020-06-29 | Discharge: 2020-07-01 | DRG: 025 | Disposition: A | Discharge status: Home / Self Care | Payer: Commercial Managed Care - PPO | Attending: Neurosurgery | Admitting: Neurosurgery

## 2020-06-29 DIAGNOSIS — R4701 Aphasia: Secondary | ICD-10-CM | POA: Diagnosis present

## 2020-06-29 DIAGNOSIS — Z79899 Other long term (current) drug therapy: Secondary | ICD-10-CM | POA: Diagnosis not present

## 2020-06-29 DIAGNOSIS — Z20822 Contact with and (suspected) exposure to covid-19: Secondary | ICD-10-CM | POA: Diagnosis present

## 2020-06-29 DIAGNOSIS — D496 Neoplasm of unspecified behavior of brain: Secondary | ICD-10-CM | POA: Diagnosis present

## 2020-06-29 DIAGNOSIS — F4321 Adjustment disorder with depressed mood: Secondary | ICD-10-CM | POA: Diagnosis present

## 2020-06-29 DIAGNOSIS — Z87891 Personal history of nicotine dependence: Secondary | ICD-10-CM

## 2020-06-29 DIAGNOSIS — G936 Cerebral edema: Secondary | ICD-10-CM | POA: Diagnosis present

## 2020-06-29 DIAGNOSIS — C711 Malignant neoplasm of frontal lobe: Secondary | ICD-10-CM | POA: Diagnosis present

## 2020-06-29 HISTORY — DX: Unspecified osteoarthritis, unspecified site: M19.90

## 2020-06-29 HISTORY — PX: APPLICATION OF CRANIAL NAVIGATION: SHX6578

## 2020-06-29 HISTORY — PX: CRANIOTOMY: SHX93

## 2020-06-29 HISTORY — DX: Depression, unspecified: F32.A

## 2020-06-29 LAB — TYPE AND SCREEN
ABO/RH(D): O POS
Antibody Screen: NEGATIVE

## 2020-06-29 LAB — POCT I-STAT 7, (LYTES, BLD GAS, ICA,H+H)
Acid-Base Excess: 4 mmol/L — ABNORMAL HIGH (ref 0.0–2.0)
Bicarbonate: 30.5 mmol/L — ABNORMAL HIGH (ref 20.0–28.0)
Calcium, Ion: 1.4 mmol/L (ref 1.15–1.40)
HCT: 40 % (ref 39.0–52.0)
Hemoglobin: 13.6 g/dL (ref 13.0–17.0)
O2 Saturation: 100 %
Potassium: 4 mmol/L (ref 3.5–5.1)
Sodium: 139 mmol/L (ref 135–145)
TCO2: 32 mmol/L (ref 22–32)
pCO2 arterial: 53.8 mmHg — ABNORMAL HIGH (ref 32.0–48.0)
pH, Arterial: 7.362 (ref 7.350–7.450)
pO2, Arterial: 434 mmHg — ABNORMAL HIGH (ref 83.0–108.0)

## 2020-06-29 LAB — SARS CORONAVIRUS 2 BY RT PCR (HOSPITAL ORDER, PERFORMED IN ~~LOC~~ HOSPITAL LAB): SARS Coronavirus 2: NEGATIVE

## 2020-06-29 LAB — ABO/RH: ABO/RH(D): O POS

## 2020-06-29 SURGERY — CRANIOTOMY TUMOR EXCISION
Anesthesia: General

## 2020-06-29 MED ORDER — HYDROMORPHONE HCL 1 MG/ML IJ SOLN: 0.2500 mg | INTRAMUSCULAR | Status: DC | PRN

## 2020-06-29 MED ORDER — SODIUM CHLORIDE 0.9 % IV SOLN
INTRAVENOUS | Status: DC | PRN
Start: 2020-06-29 — End: 2020-06-29

## 2020-06-29 MED ORDER — PHENYLEPHRINE HCL-NACL 10-0.9 MG/250ML-% IV SOLN
INTRAVENOUS | Status: DC | PRN
Start: 2020-06-29 — End: 2020-06-29
  Administered 2020-06-29: 40 ug/min via INTRAVENOUS

## 2020-06-29 MED ORDER — LABETALOL HCL 5 MG/ML IV SOLN: 10.0000 mg | INTRAVENOUS | Status: DC | PRN

## 2020-06-29 MED ORDER — CEFAZOLIN SODIUM-DEXTROSE 2-4 GM/100ML-% IV SOLN
2.0000 g | Freq: Three times a day (TID) | INTRAVENOUS | Status: AC
  Administered 2020-06-30 (×2): 2 g via INTRAVENOUS
  Filled 2020-06-29 (×2): qty 100

## 2020-06-29 MED ORDER — PROPOFOL 10 MG/ML IV BOLUS
INTRAVENOUS | Status: AC
  Filled 2020-06-29: qty 20

## 2020-06-29 MED ORDER — DEXAMETHASONE SODIUM PHOSPHATE 4 MG/ML IJ SOLN
4.0000 mg | Freq: Four times a day (QID) | INTRAMUSCULAR | Status: DC
  Administered 2020-06-30 – 2020-07-01 (×3): 4 mg via INTRAVENOUS
  Filled 2020-06-29 (×3): qty 1

## 2020-06-29 MED ORDER — DEXAMETHASONE SODIUM PHOSPHATE 10 MG/ML IJ SOLN
6.0000 mg | Freq: Four times a day (QID) | INTRAMUSCULAR | Status: AC
  Administered 2020-06-29 – 2020-06-30 (×4): 6 mg via INTRAVENOUS
  Filled 2020-06-29 (×4): qty 1

## 2020-06-29 MED ORDER — BACITRACIN ZINC 500 UNIT/GM EX OINT
TOPICAL_OINTMENT | CUTANEOUS | Status: DC | PRN
  Administered 2020-06-29: 1 via TOPICAL

## 2020-06-29 MED ORDER — FENTANYL CITRATE (PF) 250 MCG/5ML IJ SOLN
INTRAMUSCULAR | Status: AC
  Filled 2020-06-29: qty 5

## 2020-06-29 MED ORDER — DEXAMETHASONE SODIUM PHOSPHATE 10 MG/ML IJ SOLN
INTRAMUSCULAR | Status: AC
  Filled 2020-06-29: qty 1

## 2020-06-29 MED ORDER — LIDOCAINE 2% (20 MG/ML) 5 ML SYRINGE
INTRAMUSCULAR | Status: AC
  Filled 2020-06-29: qty 5

## 2020-06-29 MED ORDER — MICROFIBRILLAR COLL HEMOSTAT EX POWD
CUTANEOUS | Status: AC
  Filled 2020-06-29: qty 5

## 2020-06-29 MED ORDER — THROMBIN 20000 UNITS EX SOLR
CUTANEOUS | Status: AC
  Filled 2020-06-29: qty 20000

## 2020-06-29 MED ORDER — ACETAMINOPHEN 325 MG PO TABS
650.0000 mg | ORAL_TABLET | ORAL | Status: DC | PRN
  Administered 2020-06-29: 650 mg via ORAL
  Filled 2020-06-29: qty 2

## 2020-06-29 MED ORDER — ROCURONIUM BROMIDE 10 MG/ML (PF) SYRINGE
PREFILLED_SYRINGE | INTRAVENOUS | Status: AC
  Filled 2020-06-29: qty 10

## 2020-06-29 MED ORDER — LEVETIRACETAM IN NACL 500 MG/100ML IV SOLN
500.0000 mg | Freq: Two times a day (BID) | INTRAVENOUS | Status: DC
  Administered 2020-06-29 – 2020-06-30 (×2): 500 mg via INTRAVENOUS
  Filled 2020-06-29 (×2): qty 100

## 2020-06-29 MED ORDER — DEXAMETHASONE SODIUM PHOSPHATE 4 MG/ML IJ SOLN: 4.0000 mg | Freq: Three times a day (TID) | INTRAMUSCULAR | Status: DC

## 2020-06-29 MED ORDER — PROPOFOL 10 MG/ML IV BOLUS
INTRAVENOUS | Status: DC | PRN
  Administered 2020-06-29: 150 mg via INTRAVENOUS
  Administered 2020-06-29 (×2): 50 mg via INTRAVENOUS
  Administered 2020-06-29: 20 mg via INTRAVENOUS

## 2020-06-29 MED ORDER — THROMBIN 5000 UNITS EX SOLR
OROMUCOSAL | Status: DC | PRN
  Administered 2020-06-29: 5 mL via TOPICAL

## 2020-06-29 MED ORDER — SODIUM CHLORIDE 0.9 % IV SOLN
INTRAVENOUS | Status: DC | PRN
  Administered 2020-06-29: 500 mL

## 2020-06-29 MED ORDER — CHLORHEXIDINE GLUCONATE CLOTH 2 % EX PADS: 6.0000 | MEDICATED_PAD | Freq: Once | CUTANEOUS | Status: DC

## 2020-06-29 MED ORDER — BUPIVACAINE-EPINEPHRINE 0.5% -1:200000 IJ SOLN
INTRAMUSCULAR | Status: DC | PRN
  Administered 2020-06-29: 10 mL

## 2020-06-29 MED ORDER — ONDANSETRON HCL 4 MG/2ML IJ SOLN
INTRAMUSCULAR | Status: DC | PRN
  Administered 2020-06-29: 4 mg via INTRAVENOUS

## 2020-06-29 MED ORDER — LIDOCAINE 2% (20 MG/ML) 5 ML SYRINGE
INTRAMUSCULAR | Status: DC | PRN
  Administered 2020-06-29: 60 mg via INTRAVENOUS

## 2020-06-29 MED ORDER — EPHEDRINE SULFATE 50 MG/ML IJ SOLN
INTRAMUSCULAR | Status: DC | PRN
  Administered 2020-06-29: 10 mg via INTRAVENOUS

## 2020-06-29 MED ORDER — BUPIVACAINE-EPINEPHRINE 0.5% -1:200000 IJ SOLN
INTRAMUSCULAR | Status: AC
  Filled 2020-06-29: qty 1

## 2020-06-29 MED ORDER — CHLORHEXIDINE GLUCONATE CLOTH 2 % EX PADS
6.0000 | MEDICATED_PAD | Freq: Every day | CUTANEOUS | Status: DC
  Administered 2020-06-30: 6 via TOPICAL

## 2020-06-29 MED ORDER — PROMETHAZINE HCL 25 MG PO TABS: 12.5000 mg | ORAL_TABLET | ORAL | Status: DC | PRN

## 2020-06-29 MED ORDER — CEFAZOLIN SODIUM-DEXTROSE 2-4 GM/100ML-% IV SOLN
2.0000 g | INTRAVENOUS | Status: AC
  Administered 2020-06-29 (×2): 2 g via INTRAVENOUS
  Filled 2020-06-29: qty 100

## 2020-06-29 MED ORDER — DOCUSATE SODIUM 100 MG PO CAPS
100.0000 mg | ORAL_CAPSULE | Freq: Two times a day (BID) | ORAL | Status: DC
  Administered 2020-06-29 – 2020-07-01 (×4): 100 mg via ORAL
  Filled 2020-06-29 (×4): qty 1

## 2020-06-29 MED ORDER — SUFENTANIL CITRATE 50 MCG/ML IV SOLN
INTRAVENOUS | Status: DC | PRN
  Administered 2020-06-29: 5 ug via INTRAVENOUS
  Administered 2020-06-29: 10 ug via INTRAVENOUS

## 2020-06-29 MED ORDER — SUGAMMADEX SODIUM 200 MG/2ML IV SOLN
INTRAVENOUS | Status: DC | PRN
  Administered 2020-06-29: 200 mg via INTRAVENOUS

## 2020-06-29 MED ORDER — MICROFIBRILLAR COLL HEMOSTAT EX PADS
MEDICATED_PAD | CUTANEOUS | Status: DC | PRN
  Administered 2020-06-29: 1 via TOPICAL

## 2020-06-29 MED ORDER — ACETAMINOPHEN 10 MG/ML IV SOLN
INTRAVENOUS | Status: DC | PRN
Start: 2020-06-29 — End: 2020-06-29
  Administered 2020-06-29: 1000 mg via INTRAVENOUS

## 2020-06-29 MED ORDER — CEFAZOLIN SODIUM 1 G IJ SOLR
INTRAMUSCULAR | Status: AC
  Filled 2020-06-29: qty 20

## 2020-06-29 MED ORDER — FENTANYL CITRATE (PF) 100 MCG/2ML IJ SOLN
INTRAMUSCULAR | Status: DC | PRN
  Administered 2020-06-29: 50 ug via INTRAVENOUS

## 2020-06-29 MED ORDER — DEXAMETHASONE SODIUM PHOSPHATE 10 MG/ML IJ SOLN
INTRAMUSCULAR | Status: DC | PRN
  Administered 2020-06-29: 20 mg via INTRAVENOUS

## 2020-06-29 MED ORDER — ALBUMIN HUMAN 5 % IV SOLN
INTRAVENOUS | Status: DC | PRN
Start: 2020-06-29 — End: 2020-06-29

## 2020-06-29 MED ORDER — HEMOSTATIC AGENTS (NO CHARGE) OPTIME
TOPICAL | Status: DC | PRN
  Administered 2020-06-29: 1 via TOPICAL

## 2020-06-29 MED ORDER — ONDANSETRON HCL 4 MG/2ML IJ SOLN: 4.0000 mg | INTRAMUSCULAR | Status: DC | PRN

## 2020-06-29 MED ORDER — ACETAMINOPHEN 650 MG RE SUPP: 650.0000 mg | RECTAL | Status: DC | PRN

## 2020-06-29 MED ORDER — SODIUM CHLORIDE 0.9 % IV SOLN: INTRAVENOUS | Status: DC

## 2020-06-29 MED ORDER — POTASSIUM CHLORIDE IN NACL 20-0.9 MEQ/L-% IV SOLN
INTRAVENOUS | Status: DC
  Filled 2020-06-29: qty 1000

## 2020-06-29 MED ORDER — THROMBIN 5000 UNITS EX SOLR
CUTANEOUS | Status: AC
  Filled 2020-06-29: qty 5000

## 2020-06-29 MED ORDER — PANTOPRAZOLE SODIUM 40 MG IV SOLR
40.0000 mg | Freq: Every day | INTRAVENOUS | Status: DC
  Administered 2020-06-29: 40 mg via INTRAVENOUS
  Filled 2020-06-29: qty 40

## 2020-06-29 MED ORDER — ONDANSETRON HCL 4 MG PO TABS: 4.0000 mg | ORAL_TABLET | ORAL | Status: DC | PRN

## 2020-06-29 MED ORDER — MORPHINE SULFATE (PF) 2 MG/ML IV SOLN
1.0000 mg | INTRAVENOUS | Status: DC | PRN
  Administered 2020-06-30 (×3): 2 mg via INTRAVENOUS
  Filled 2020-06-29 (×3): qty 1

## 2020-06-29 MED ORDER — ACETAMINOPHEN 10 MG/ML IV SOLN
INTRAVENOUS | Status: AC
  Filled 2020-06-29: qty 100

## 2020-06-29 MED ORDER — BACITRACIN ZINC 500 UNIT/GM EX OINT
TOPICAL_OINTMENT | CUTANEOUS | Status: AC
  Filled 2020-06-29: qty 28.35

## 2020-06-29 MED ORDER — SODIUM CHLORIDE 0.9% IV SOLUTION: Freq: Once | INTRAVENOUS | Status: DC

## 2020-06-29 MED ORDER — THROMBIN 20000 UNITS EX SOLR
CUTANEOUS | Status: DC | PRN
  Administered 2020-06-29: 20 mL via TOPICAL

## 2020-06-29 MED ORDER — CHLORHEXIDINE GLUCONATE 0.12 % MT SOLN
OROMUCOSAL | Status: AC
  Administered 2020-06-29: 15 mL
  Filled 2020-06-29: qty 15

## 2020-06-29 MED ORDER — SUFENTANIL CITRATE 250 MCG/5ML IV SOLN
0.2500 ug/kg/h | INTRAVENOUS | Status: AC
  Administered 2020-06-29: .25 ug/kg/h via INTRAVENOUS
  Filled 2020-06-29 (×2): qty 5

## 2020-06-29 MED ORDER — MIDAZOLAM HCL 2 MG/2ML IJ SOLN
INTRAMUSCULAR | Status: AC
  Filled 2020-06-29: qty 2

## 2020-06-29 MED ORDER — ROCURONIUM BROMIDE 10 MG/ML (PF) SYRINGE
PREFILLED_SYRINGE | INTRAVENOUS | Status: DC | PRN
  Administered 2020-06-29: 70 mg via INTRAVENOUS
  Administered 2020-06-29 (×5): 20 mg via INTRAVENOUS
  Administered 2020-06-29: 30 mg via INTRAVENOUS
  Administered 2020-06-29 (×3): 20 mg via INTRAVENOUS

## 2020-06-29 MED ORDER — 0.9 % SODIUM CHLORIDE (POUR BTL) OPTIME
TOPICAL | Status: DC | PRN
  Administered 2020-06-29: 3000 mL

## 2020-06-29 MED ORDER — HYDROCODONE-ACETAMINOPHEN 5-325 MG PO TABS
1.0000 | ORAL_TABLET | ORAL | Status: DC | PRN
  Administered 2020-06-29 – 2020-06-30 (×2): 1 via ORAL
  Filled 2020-06-29 (×2): qty 1

## 2020-06-29 MED ORDER — ONDANSETRON HCL 4 MG/2ML IJ SOLN
INTRAMUSCULAR | Status: AC
  Filled 2020-06-29: qty 2

## 2020-06-29 SURGICAL SUPPLY — 81 items
BAG DECANTER FOR FLEXI CONT (MISCELLANEOUS) ×3 IMPLANT
BAND RUBBER #18 3X1/16 STRL (MISCELLANEOUS) ×6 IMPLANT
BATTERY IQ STERILE (MISCELLANEOUS) IMPLANT
BIT DRILL WIRE PASS 1.3MM (BIT) IMPLANT
BLADE CLIPPER SPEC (BLADE) IMPLANT
BLADE ULTRA TIP 2M (BLADE) ×3 IMPLANT
BUR ACORN 6.0 PRECISION (BURR) ×3 IMPLANT
BUR PRECISION FLUTE 6.0 (BURR) ×6 IMPLANT
BUR SPIRAL ROUTER 2.3 (BUR) ×3 IMPLANT
CANISTER SUCT 3000ML PPV (MISCELLANEOUS) ×6 IMPLANT
CARTRIDGE OIL MAESTRO DRILL (MISCELLANEOUS) ×2 IMPLANT
CATH VENTRIC 35X38 W/TROCAR LG (CATHETERS) IMPLANT
CLIP VESOCCLUDE MED 6/CT (CLIP) IMPLANT
CNTNR URN SCR LID CUP LEK RST (MISCELLANEOUS) ×8 IMPLANT
CONT SPEC 4OZ STRL OR WHT (MISCELLANEOUS) ×12
COVER BACK TABLE 60X90IN (DRAPES) IMPLANT
COVER WAND RF STERILE (DRAPES) IMPLANT
DIFFUSER DRILL AIR PNEUMATIC (MISCELLANEOUS) ×3 IMPLANT
DRAPE MICROSCOPE LEICA (MISCELLANEOUS) ×3 IMPLANT
DRAPE NEUROLOGICAL W/INCISE (DRAPES) ×3 IMPLANT
DRAPE SURG 17X23 STRL (DRAPES) IMPLANT
DRAPE WARM FLUID 44X44 (DRAPES) ×3 IMPLANT
DRILL WIRE PASS 1.3MM (BIT)
ELECT REM PT RETURN 9FT ADLT (ELECTROSURGICAL) ×3
ELECTRODE REM PT RTRN 9FT ADLT (ELECTROSURGICAL) ×2 IMPLANT
EVACUATOR 1/8 PVC DRAIN (DRAIN) IMPLANT
EVACUATOR SILICONE 100CC (DRAIN) IMPLANT
FORCEPS BIPOLAR SPETZLER 8 1.0 (NEUROSURGERY SUPPLIES) ×3 IMPLANT
GAUZE 4X4 16PLY RFD (DISPOSABLE) IMPLANT
GAUZE SPONGE 4X4 12PLY STRL (GAUZE/BANDAGES/DRESSINGS) ×3 IMPLANT
GLOVE BIO SURGEON STRL SZ8 (GLOVE) ×3 IMPLANT
GLOVE BIO SURGEON STRL SZ8.5 (GLOVE) ×3 IMPLANT
GLOVE EXAM NITRILE XL STR (GLOVE) IMPLANT
GOWN STRL REUS W/ TWL LRG LVL3 (GOWN DISPOSABLE) IMPLANT
GOWN STRL REUS W/ TWL XL LVL3 (GOWN DISPOSABLE) ×2 IMPLANT
GOWN STRL REUS W/TWL LRG LVL3 (GOWN DISPOSABLE)
GOWN STRL REUS W/TWL XL LVL3 (GOWN DISPOSABLE) ×3
HEMOSTAT POWDER KIT SURGIFOAM (HEMOSTASIS) ×3 IMPLANT
HEMOSTAT SURGICEL 2X14 (HEMOSTASIS) ×3 IMPLANT
KIT BASIN OR (CUSTOM PROCEDURE TRAY) ×3 IMPLANT
KIT DRAIN CSF ACCUDRAIN (MISCELLANEOUS) IMPLANT
KIT TURNOVER KIT B (KITS) ×3 IMPLANT
MARKER SKIN DUAL TIP RULER LAB (MISCELLANEOUS) ×3 IMPLANT
MARKER SPHERE PSV REFLC 13MM (MARKER) ×9 IMPLANT
NEEDLE HYPO 22GX1.5 SAFETY (NEEDLE) ×3 IMPLANT
NS IRRIG 1000ML POUR BTL (IV SOLUTION) ×9 IMPLANT
OIL CARTRIDGE MAESTRO DRILL (MISCELLANEOUS) ×3
PACK CRANIOTOMY CUSTOM (CUSTOM PROCEDURE TRAY) ×3 IMPLANT
PAD ARMBOARD 7.5X6 YLW CONV (MISCELLANEOUS) ×3 IMPLANT
PATTIES SURGICAL .25X.25 (GAUZE/BANDAGES/DRESSINGS) IMPLANT
PATTIES SURGICAL .5 X.5 (GAUZE/BANDAGES/DRESSINGS) ×3 IMPLANT
PATTIES SURGICAL .5 X3 (DISPOSABLE) ×3 IMPLANT
PATTIES SURGICAL 1X1 (DISPOSABLE) IMPLANT
PIN MAYFIELD SKULL DISP (PIN) ×3 IMPLANT
PLATE CRANIAL 12 2H RIGID UNI (Plate) ×9 IMPLANT
SCREW UNIII AXS SD 1.5X4 (Screw) ×18 IMPLANT
SET CARTRIDGE AND TUBING (SET/KITS/TRAYS/PACK) ×3 IMPLANT
SPECIMEN JAR SMALL (MISCELLANEOUS) IMPLANT
SPONGE NEURO XRAY DETECT 1X3 (DISPOSABLE) ×3 IMPLANT
SPONGE SURGIFOAM ABS GEL 100 (HEMOSTASIS) ×3 IMPLANT
STAPLER SKIN PROX WIDE 3.9 (STAPLE) ×3 IMPLANT
STOCKINETTE 6  STRL (DRAPES)
STOCKINETTE 6 STRL (DRAPES) IMPLANT
SUT ETHILON 3 0 FSL (SUTURE) IMPLANT
SUT ETHILON 3 0 PS 1 (SUTURE) IMPLANT
SUT NURALON 4 0 TR CR/8 (SUTURE) ×6 IMPLANT
SUT PROLENE 6 0 BV (SUTURE) IMPLANT
SUT SILK 0 TIES 10X30 (SUTURE) IMPLANT
SUT VIC AB 2-0 CP2 18 (SUTURE) ×6 IMPLANT
SUT VIC AB 3-0 FS2 27 (SUTURE) IMPLANT
SUT VICRYL 4-0 PS2 18IN ABS (SUTURE) IMPLANT
TAPE CLOTH SURG 4X10 WHT LF (GAUZE/BANDAGES/DRESSINGS) ×3 IMPLANT
TIP STANDARD 36KHZ (INSTRUMENTS) ×3
TIP STD 36KHZ (INSTRUMENTS) ×2 IMPLANT
TOWEL GREEN STERILE (TOWEL DISPOSABLE) ×3 IMPLANT
TOWEL GREEN STERILE FF (TOWEL DISPOSABLE) ×3 IMPLANT
TRAY FOLEY MTR SLVR 16FR STAT (SET/KITS/TRAYS/PACK) ×3 IMPLANT
TUBE CONNECTING 12X1/4 (SUCTIONS) IMPLANT
UNDERPAD 30X36 HEAVY ABSORB (UNDERPADS AND DIAPERS) IMPLANT
WATER STERILE IRR 1000ML POUR (IV SOLUTION) ×3 IMPLANT
WRENCH TORQUE 36KHZ (INSTRUMENTS) ×3 IMPLANT

## 2020-06-29 NOTE — Transfer of Care (Signed)
Immediate Anesthesia Transfer of Care Note  Patient: Robert Espinoza  Procedure(s) Performed: Left Frontal CRANIOTOMY TUMOR EXCISION (Left ) APPLICATION OF CRANIAL NAVIGATION (N/A )  Patient Location: PACU  Anesthesia Type:General  Level of Consciousness: awake and patient cooperative  Airway & Oxygen Therapy: Patient Spontanous Breathing and Patient connected to nasal cannula oxygen  Post-op Assessment: Report given to RN and Post -op Vital signs reviewed and stable  Post vital signs: Reviewed and stable  Last Vitals:  Vitals Value Taken Time  BP 120/78 06/29/20 1736  Temp    Pulse 61 06/29/20 1741  Resp 11 06/29/20 1741  SpO2 96 % 06/29/20 1741  Vitals shown include unvalidated device data.  Last Pain:  Vitals:   06/29/20 1022  TempSrc:   PainSc: 0-No pain         Complications: No complications documented.

## 2020-06-29 NOTE — Anesthesia Postprocedure Evaluation (Signed)
Anesthesia Post Note  Patient: Robert Espinoza  Procedure(s) Performed: Left Frontal CRANIOTOMY TUMOR EXCISION (Left ) APPLICATION OF CRANIAL NAVIGATION (N/A )     Patient location during evaluation: PACU Anesthesia Type: General Level of consciousness: awake and alert Pain management: pain level controlled Vital Signs Assessment: post-procedure vital signs reviewed and stable Respiratory status: spontaneous breathing, nonlabored ventilation, respiratory function stable and patient connected to nasal cannula oxygen Cardiovascular status: blood pressure returned to baseline and stable Postop Assessment: no apparent nausea or vomiting Anesthetic complications: no   No complications documented.  Last Vitals:  Vitals:   06/29/20 1735 06/29/20 1750  BP:  104/73  Pulse:  60  Resp: 11 11  Temp: 36.8 C   SpO2:  96%    Last Pain:  Vitals:   06/29/20 1750  TempSrc:   PainSc: 0-No pain                 [name],[name]

## 2020-06-29 NOTE — Anesthesia Preprocedure Evaluation (Signed)
Anesthesia Evaluation  Patient identified by MRN, date of birth, ID band Patient awake    Reviewed: Allergy & Precautions, H&P , NPO status , Patient's Chart, lab work & pertinent test results  Airway Mallampati: II  TM Distance: >3 FB Neck ROM: Full    Dental no notable dental hx. (+) Teeth Intact, Dental Advisory Given   Pulmonary neg pulmonary ROS,    Pulmonary exam normal breath sounds clear to auscultation       Cardiovascular negative cardio ROS   Rhythm:Regular Rate:Normal     Neuro/Psych Depression Brain Tumor Aphasia    GI/Hepatic negative GI ROS, Neg liver ROS,   Endo/Other  negative endocrine ROS  Renal/GU negative Renal ROS  negative genitourinary   Musculoskeletal  (+) Arthritis , Osteoarthritis,    Abdominal   Peds  Hematology negative hematology ROS (+)   Anesthesia Other Findings   Reproductive/Obstetrics negative OB ROS                             Anesthesia Physical  Anesthesia Plan  ASA: III  Anesthesia Plan: General   Post-op Pain Management:    Induction: Intravenous  PONV Risk Score and Plan: 3 and Ondansetron, Dexamethasone and Midazolam  Airway Management Planned: Oral ETT  Additional Equipment: Arterial line  Intra-op Plan:   Post-operative Plan: Extubation in OR  Informed Consent: I have reviewed the patients History and Physical, chart, labs and discussed the procedure including the risks, benefits and alternatives for the proposed anesthesia with the patient or authorized representative who has indicated his/her understanding and acceptance.     Dental advisory given  Plan Discussed with: CRNA  Anesthesia Plan Comments:         Anesthesia Quick Evaluation  

## 2020-06-29 NOTE — Progress Notes (Signed)
Subjective: The patient is alert and pleasant.  He is in no apparent distress.  He looks well.  Objective: Vital signs in last 24 hours: Temp:  [97.5 F (36.4 C)-98.3 F (36.8 C)] 98.3 F (36.8 C) (07/07 1735) Pulse Rate:  [51-60] 60 (07/07 1750) Resp:  [11-20] 11 (07/07 1750) BP: (104-146)/(73-95) 104/73 (07/07 1750) SpO2:  [96 %-99 %] 96 % (07/07 1750) Arterial Line BP: (117)/(68) 117/68 (07/07 1750) Weight:  [95.3 kg] 95.3 kg (07/07 1002) Estimated body mass index is 29.29 kg/m as calculated from the following:   Height as of this encounter: 5\' 11"  (1.803 m).   Weight as of this encounter: 95.3 kg.   Intake/Output from previous day: No intake/output data recorded. Intake/Output this shift: Total I/O In: 2350 [I.V.:1900; IV Piggyback:450] Out: 3425 [Urine:3150; Blood:275]  Physical exam the patient is alert and pleasant.  He told me his name.  He is moving all 4 extremities well.  His dressing is clean and dry.  His pupils are equal.  Lab Results: Recent Labs    06/29/20 1251  HGB 13.6  HCT 40.0   BMET Recent Labs    06/29/20 1251  NA 139  K 4.0    Studies/Results: No results found.  Assessment/Plan: The patient is doing well.  I spoke with his wife.  We will plan to get an MRI scan tomorrow to gauge the extent of resection.  LOS: 0 days     Ophelia Charter 06/29/2020, 6:12 PM

## 2020-06-29 NOTE — H&P (Signed)
Subjective: The patient is a 54 year old white male who presented last week with an expressive aphasia.  He was worked up with a head CT and brain MRI which demonstrated a left frontal brain tumor.  The patient was started on Decadron and Keppra.  I discussed the various treatment options with the patient and his wife.  I recommended a craniotomy for resection of the tumor.  The patient and his wife consented.  Surgery was planned for this week.  He is admitted today for the surgery.  Past Medical History:  Diagnosis Date  . Arthritis   . Depression    situational    Past Surgical History:  Procedure Laterality Date  . CERVICAL FUSION    . COLONOSCOPY W/ POLYPECTOMY    . KNEE ARTHROSCOPY      No Known Allergies  Social History   Tobacco Use  . Smoking status: Never Smoker  . Smokeless tobacco: Former Network engineer Use Topics  . Alcohol use: Not Currently    Comment: 06/28/2020- prior to starting medications for brain mass- he drank 56 beers a week    History reviewed. No pertinent family history. Prior to Admission medications   Medication Sig Start Date End Date Taking? Authorizing Provider  acetaminophen (TYLENOL) 325 MG tablet Take 650 mg by mouth every 6 (six) hours as needed for headache.   Yes [provider]  dexamethasone (DECADRON) 4 MG tablet Take 1 tablet (4 mg total) by mouth 3 (three) times daily. 06/23/20  Yes Kristeen Miss, MD  levETIRAcetam (KEPPRA) 500 MG tablet Take 1 tablet (500 mg total) by mouth 2 (two) times daily. 06/23/20  Yes Kristeen Miss, MD  pantoprazole (PROTONIX) 40 MG tablet Take 1 tablet (40 mg total) by mouth 2 (two) times daily. 06/23/20  Yes Kristeen Miss, MD  amphetamine-dextroamphetamine (ADDERALL) 20 MG tablet Take 20 mg by mouth every morning. Patient not taking: Reported on 06/27/2020 05/14/20   [provider]  FLUoxetine (PROZAC) 40 MG capsule Take 40 mg by mouth daily.    [provider]     Review of  Systems  Positive ROS: As above  All other systems have been reviewed and were otherwise negative with the exception of those mentioned in the HPI and as above.  Objective: Vital signs in last 24 hours: Temp:  [97.5 F (36.4 C)] 97.5 F (36.4 C) (07/07 1002) Pulse Rate:  [51] 51 (07/07 1002) Resp:  [20] 20 (07/07 1002) BP: (146)/(95) 146/95 (07/07 1002) SpO2:  [99 %] 99 % (07/07 1002) Weight:  [95.3 kg] 95.3 kg (07/07 1002) Estimated body mass index is 29.29 kg/m as calculated from the following:   Height as of this encounter: 5\' 11"  (1.803 m).   Weight as of this encounter: 95.3 kg.   General Appearance: Alert Head: Normocephalic, without obvious abnormality, atraumatic Eyes: PERRL, conjunctiva/corneas clear, EOM's intact,    Ears: Normal  Throat: Normal  Neck: Supple, Back: unremarkable Lungs: Clear to auscultation bilaterally, respirations unlabored Heart: Regular rate and rhythm, no murmur, rub or gallop Abdomen: Soft, non-tender Extremities: Extremities normal, atraumatic, no cyanosis or edema Skin: unremarkable  NEUROLOGIC:   The patient is alert and pleasant.  He has an expressive dysphagia/aphasia.  He answers simple questions appropriately.  He is moving all 4 extremities well.  His pupils are equal.   Data Review Lab Results  Component Value Date   WBC 7.2 06/22/2020   HGB 14.9 06/22/2020   HCT 44.1 06/22/2020   MCV 88.7 06/22/2020  PLT 328 06/22/2020   Lab Results  Component Value Date   NA 137 06/22/2020   K 5.1 06/22/2020   CL 106 06/22/2020   CO2 22 06/22/2020   BUN 10 06/22/2020   CREATININE 0.88 06/22/2020   GLUCOSE 186 (H) 06/22/2020   Lab Results  Component Value Date   INR 1.0 06/22/2020    Assessment/Plan: Left frontal brain tumor, aphasia: I have discussed the situation with the patient and his wife.  We have discussed the various treatment options including surgery.  I have recommended a craniotomy for resection of the tumor.  I  explained the procedure as well as the risks, benefits, alternatives, expected postoperative course, and likelihood of achieving our goals with surgery.  I have answered all their questions.  They have decided to proceed with surgery.   Ophelia Charter 06/29/2020 12:20 PM

## 2020-06-29 NOTE — Anesthesia Procedure Notes (Signed)
Procedure Name: Intubation Date/Time: 06/29/2020 12:40 PM Performed by: Imagene Riches, CRNA Pre-anesthesia Checklist: Patient identified, Emergency Drugs available, Suction available and Patient being monitored Patient Re-evaluated:Patient Re-evaluated prior to induction Oxygen Delivery Method: Circle System Utilized Preoxygenation: Pre-oxygenation with 100% oxygen Induction Type: IV induction Ventilation: Mask ventilation without difficulty Laryngoscope Size: Mac and 4 Grade View: Grade II Tube type: Oral Tube size: 7.5 mm Number of attempts: 1 Airway Equipment and Method: Stylet Placement Confirmation: ETT inserted through vocal cords under direct vision,  positive ETCO2 and breath sounds checked- equal and bilateral Secured at: 22 cm Tube secured with: Tape Dental Injury: Teeth and Oropharynx as per pre-operative assessment  Comments: Performed by Reece Agar, SRNA under the supervision of CRNA and MDA

## 2020-06-29 NOTE — Anesthesia Procedure Notes (Signed)
Arterial Line Insertion Start/End7/06/2020 11:45 AM, 06/29/2020 11:55 AM Performed by: Roderic Palau, MD, Imagene Riches, CRNA, CRNA  Patient location: Pre-op. Preanesthetic checklist: patient identified, IV checked, site marked, risks and benefits discussed, surgical consent, monitors and equipment checked, pre-op evaluation, timeout performed and anesthesia consent Lidocaine 1% used for infiltration Left, radial was placed Catheter size: 20 Fr Hand hygiene performed , maximum sterile barriers used  and Seldinger technique used Allen's test indicative of satisfactory collateral circulation Attempts: 2 Procedure performed without using ultrasound guided technique. Following insertion, dressing applied and Biopatch. Post procedure assessment: normal and unchanged  Patient tolerated the procedure well with no immediate complications.

## 2020-06-29 NOTE — Op Note (Signed)
Brief history: The patient is a 54 year old white male who presented a week ago with aphasia.  He was worked up with a head CT and a brain MRI which demonstrated a left frontal brain tumor.  I discussed the various treatment options with the patient and his wife.  He has weighed the risks, benefits and alternatives and decided to proceed with a craniotomy for resection of this lesion.  Preop diagnosis: Left frontal brain tumor  Postop diagnosis: The same (Preliminary diagnosis malignant primary tumor)  Procedure: Left frontal craniotomy with frameless stereotactic neuro navigation using microdissection for resection of left frontal brain tumor  Surgeon: Dr. Earle Gell  Assistant: Dr. Dayton Bailiff and Arnetha Massy, NP  Anesthesia: General tracheal  Estimated blood loss: 200 cc  Specimens: Tumor  Drains: None  Complications: None  Description of procedure:  The patient was brought to the operating room by the anesthesia team.  General endotracheal anesthesia was induced.  The patient remained in the supine position.  I applied the [place] three-point headrest to the patient's calvarium.  We entered the surface accordance to the [place] computer.  The patient's left frontal scalp was then shaved and then prepared with Betadine scrub and Betadine solution.  Sterile drapes were applied.  I then injected the area to be incised with Marcaine with epinephrine solution.  I used a scalpel to make a curvilinear incision in the patient's left frontal scalp just behind the hairline, centered about the coronal suture.  We used Raney clips for wound edge hemostasis.  We used electrocautery to divide the underlying periosteum and then used a periosteal elevator to expose the skull.  We used cerebellar retractors for exposure.  We used the [facility] neuro navigation to help US guide the placement of the craniotomy flap.  We then used a high-speed drill to create a left frontal burr hole.  We then used  the footplate device to create a left frontal craniotomy flap.  We elevated the craniotomy flap with a Penfield #3.  We then incised the dura in a cruciate fashion with the base of the flap towards the sagittal sinus.  We tacked up the dural edges.  We applied the buddy halo.  I then used bipolar electrocautery and suction to create a corticotomy.  We dissected deeper and encountered obviously abnormal tissue.  We sent off multiple specimens to the pathologist which came back consistent with necrotic tissue and tumor.  The tumor was quite rubbery and fairly well-circumscribed.  We used the [place] to internally debulk the tumor.  The inside of the lesion was more liquefied and necrotic.  This gave [place] enough room to dissect around the edges of the tumor until we encountered a relatively normal brain.  We were able to resect all the grossly abnormal tissue.  We confirmed the resection cavity with the [facility] neuro navigation.  We then used electrocautery, Gelfoam, and Avitene to provide hemostasis.  We then irrigated this out with bacitracin solution and saline solution.  The irrigant came back crystal-clear.  We then reapproximated the patient's dura with interrupted 4-0 Nurolon suture.  We placed some Gelfoam over the exposed dura and replaced the patient's craniotomy flap with titanium mini plates and screws.  We removed the cerebellar retractor and then reapproximated the patient's galea with interrupted 2-0 Vicryl suture.  We reapproximated the skin with stainless steel staples.  The wound was then coated with bacitracin ointment.  A sterile dressing was applied.  The drapes were removed.  We then  removed the [place] three-point headrest from the patient's calvarium.  By report all sponge, instrument, and needle counts were correct at the end of this case.

## 2020-06-30 ENCOUNTER — Encounter (HOSPITAL_COMMUNITY): Payer: Self-pay | Admitting: Neurosurgery

## 2020-06-30 ENCOUNTER — Inpatient Hospital Stay (HOSPITAL_COMMUNITY): Payer: Commercial Managed Care - PPO

## 2020-06-30 MED ORDER — PANTOPRAZOLE SODIUM 40 MG PO TBEC
40.0000 mg | DELAYED_RELEASE_TABLET | Freq: Every day | ORAL | Status: DC
  Administered 2020-06-30: 40 mg via ORAL
  Filled 2020-06-30: qty 1

## 2020-06-30 MED ORDER — OXYCODONE-ACETAMINOPHEN 5-325 MG PO TABS
1.0000 | ORAL_TABLET | ORAL | Status: DC | PRN
  Administered 2020-06-30 – 2020-07-01 (×3): 2 via ORAL
  Filled 2020-06-30 (×3): qty 2

## 2020-06-30 MED ORDER — GADOBUTROL 1 MMOL/ML IV SOLN
9.0000 mL | Freq: Once | INTRAVENOUS | Status: AC | PRN
  Administered 2020-06-30: 9 mL via INTRAVENOUS

## 2020-06-30 MED ORDER — LEVETIRACETAM 500 MG PO TABS
500.0000 mg | ORAL_TABLET | Freq: Two times a day (BID) | ORAL | Status: DC
  Administered 2020-06-30 – 2020-07-01 (×2): 500 mg via ORAL
  Filled 2020-06-30 (×2): qty 1

## 2020-06-30 NOTE — Progress Notes (Signed)
Subjective: The patient is alert and pleasant.  He is in no apparent distress.  He looks well.  Objective: Vital signs in last 24 hours: Temp:  [97.5 F (36.4 C)-98.3 F (36.8 C)] 98.1 F (36.7 C) (07/08 0400) Pulse Rate:  [47-67] 47 (07/08 0700) Resp:  [10-20] 12 (07/08 0700) BP: (95-146)/(64-95) 105/76 (07/08 0700) SpO2:  [93 %-99 %] 94 % (07/08 0700) Arterial Line BP: (87-133)/(47-104) 99/83 (07/08 0700) Weight:  [92.5 kg-95.3 kg] 92.5 kg (07/07 1840) Estimated body mass index is 28.44 kg/m as calculated from the following:   Height as of this encounter: 5\' 11"  (1.803 m).   Weight as of this encounter: 92.5 kg.   Intake/Output from previous day: 07/07 0701 - 07/08 0700 In: 3499.2 [I.V.:2788.4; IV Piggyback:710.8] Out: 7939 [Urine:5700; Blood:275] Intake/Output this shift: No intake/output data recorded.  Physical exam the patient is alert and pleasant.  His speech is better.  His strength is normal.  His pupils are equal.  His dressing is clean and dry.  Lab Results: Recent Labs    06/29/20 1251  HGB 13.6  HCT 40.0   BMET Recent Labs    06/29/20 1251  NA 139  K 4.0    Studies/Results: No results found.  Assessment/Plan: Postop day #1: The patient is doing well.  We will get a MRI scan to gauge the extent of resection.  Pathology is pending.  I have answered all his questions.  He will likely go home in a day or 2.  LOS: 1 day     Ophelia Charter 06/30/2020, 8:15 AM

## 2020-07-01 LAB — SURGICAL PATHOLOGY

## 2020-07-01 MED ORDER — DOCUSATE SODIUM 100 MG PO CAPS: 100.0000 mg | ORAL_CAPSULE | Freq: Two times a day (BID) | ORAL | 0 refills | Status: DC

## 2020-07-01 MED ORDER — OXYCODONE-ACETAMINOPHEN 5-325 MG PO TABS
1.0000 | ORAL_TABLET | ORAL | Status: DC | PRN
  Administered 2020-07-01: 1 via ORAL
  Filled 2020-07-01: qty 1

## 2020-07-01 MED ORDER — OXYCODONE-ACETAMINOPHEN 5-325 MG PO TABS: 1.0000 | ORAL_TABLET | ORAL | 0 refills | Status: DC | PRN

## 2020-07-01 MED ORDER — DEXAMETHASONE 4 MG PO TABS: 4.0000 mg | ORAL_TABLET | Freq: Three times a day (TID) | ORAL | Status: DC

## 2020-07-01 NOTE — Evaluation (Signed)
Speech Language Pathology Evaluation Patient Details Name: Robert Espinoza MRN: [id number] DOB: 21-Mar-1966 Today's Date: 07/01/2020 Time: 0923-3007 SLP Time Calculation (min) (ACUTE ONLY): 29 min  Problem List:  Patient Active Problem List   Diagnosis Date Noted  . Brain tumor ([place]) 06/22/2020   Past Medical History:  Past Medical History:  Diagnosis Date  . Arthritis   . Depression    situational   Past Surgical History:  Past Surgical History:  Procedure Laterality Date  . APPLICATION OF CRANIAL NAVIGATION N/A 06/29/2020   Procedure: APPLICATION OF CRANIAL NAVIGATION;  Surgeon: Newman Pies, MD;  Location: [place];  Service: Neurosurgery;  Laterality: N/A;  . CERVICAL FUSION    . COLONOSCOPY W/ POLYPECTOMY    . CRANIOTOMY Left 06/29/2020   Procedure: Left Frontal CRANIOTOMY TUMOR EXCISION;  Surgeon: Newman Pies, MD;  Location: [place];  Service: Neurosurgery;  Laterality: Left;  . KNEE ARTHROSCOPY     HPI:  54 year old male presented last week with an expressive aphasia.  He was worked up with a head CT and brain MRI which demonstrated a left frontal brain tumor.  The patient was started on Decadron and Keppra. The patient was admitted 7/7 for craniotomy and resection of the tumor.   Assessment / Plan / Recommendation Clinical Impression  Pt presents with a mild expressive aphasia, impacting oral and written communication and marked by slow, deliberate construction of verbal and written sentences.  Output is fluent, with no dysarthria.  There are occasional semantic paragraphic errors during writing.  During verbal output, there are hesistancies during word-retrieval.  Naming to confrontation and responsive naming were WNL; breakdown in retrieval evident during tasks of divergent naming.  Comprehension is WNL.  Pt is preparing for D/C today. He will benefit from OP SLP evaluation and f/u given his youth, work responsibilities ([employer], an [employer]).  D/W pt, who is in  agreement.      SLP Assessment  SLP Recommendation/Assessment: All further Speech Lanaguage Pathology  needs can be addressed in the next venue of care SLP Visit Diagnosis: Aphasia (R47.01)    Follow Up Recommendations    OP SLP   Frequency and Duration           SLP Evaluation Cognition  Overall Cognitive Status: Within Functional Limits for tasks assessed Arousal/Alertness: Awake/alert Orientation Level: Oriented X4 Attention: Selective Selective Attention: Appears intact       Comprehension  Auditory Comprehension Overall Auditory Comprehension: Appears within functional limits for tasks assessed Visual Recognition/Discrimination Discrimination: Within Function Limits Reading Comprehension Reading Status: Within funtional limits    Expression Expression Primary Mode of Expression: Verbal Verbal Expression Overall Verbal Expression: Impaired Initiation: Impaired Automatic Speech:  (wfl) Level of Generative/Spontaneous Verbalization: Sentence Repetition: No impairment Naming: Impairment Responsive: 76-100% accurate Confrontation: Within functional limits Divergent: 25-49% accurate Pragmatics: No impairment Written Expression Dominant Hand: Right Written Expression: Exceptions to [facility] Self Formulation Ability:  (semantic paraphasias during writing tasks)   Oral / Motor  Oral Motor/Sensory Function Overall Oral Motor/Sensory Function: Within functional limits Motor Speech Overall Motor Speech: Appears within functional limits for tasks assessed   GO                    Juan Quam Laurice 07/01/2020, 10:04 AM  Estill Bamberg L. Tivis Ringer, [place] Office number [phone] Pager [phone]

## 2020-07-01 NOTE — TOC Transition Note (Signed)
Transition of Care [facility]) - CM/SW Discharge Note   Patient Details  Name: Robert Espinoza MRN: [id number] Date of Birth: 1966-10-06  [facility] CM/SW Contact:  Robert Bodo, RN Phone Number: 07/01/2020, 12:15 PM   Clinical Narrative:   Pt admitted on 06/29/20 with brain tumor s/p resection.  ST recommending OP therapy.  Referral made to [facility] for follow up.  Faxed referral form to rehab center at [phone].  Patient given appointment information.    Final next level of care: OP Rehab Barriers to Discharge: Barriers Resolved                       Discharge Plan and Services   Discharge Planning Services: CM Consult                                 Social Determinants of Health (SDOH) Interventions     Readmission Risk Interventions No flowsheet data found.  Robert Raddle, RN, BSN  Trauma/Neuro ICU Case Manager [phone]

## 2020-07-01 NOTE — Progress Notes (Addendum)
PT Cancellation Note  Patient Details Name: Robert Espinoza MRN: [id number] DOB: [birth year]   Cancelled Treatment:    Reason Eval/Treat Not Completed: Patient declined, no reason specified. Pt declining PT evaluation at this time, reports he is mobilizing well, only having some "wooziness" initially with standing but that it resolves quickly and is otherwise independent in mobility. PT does observe the pt ambulate independently to the bathroom with no significant balance deviations. Pt does express concern over continued expressive aphasia and word finding difficulties and wants to make sure he is seen by an SLP prior to discharging. PT relays message to the assigned SLP.   Zenaida Niece 07/01/2020, 9:05 AM

## 2020-07-01 NOTE — Discharge Summary (Signed)
Physician Discharge Summary  Patient ID: Robert Espinoza MRN: [id number] DOB/AGE: 06-28-1966 54 y.o.  Admit date: 06/29/2020 Discharge date: 07/01/2020  Admission Diagnoses: Left frontal brain tumor  Discharge Diagnoses: The same Active Problems:   Brain tumor [facility])   Discharged Condition: good  Hospital Course: I performed a left frontal stereotactic craniotomy on the patient on 06/29/2020.  The surgery went well.  The patient's postoperative course was unremarkable.  On postoperative day #2 he requested discharge to home.  He was given written and oral discharge instructions.  All his questions were answered.  Pathology is pending.  He was instructed to follow-up with me in a week to have his staples removed and go over the pathology.  Consults: Care management Significant Diagnostic Studies: Brain MRI Treatments: Stereotactic left frontal craniotomy using microdissection Discharge Exam: Blood pressure 101/73, pulse (!) 47, temperature 98.1 F (36.7 C), temperature source Oral, resp. rate (!) 9, height 5\' 11"  (1.803 m), weight 92.5 kg, SpO2 90 %. The patient is alert and pleasant.  His speech is much better.  His strength is normal.  He looks well.  Disposition: Home  Discharge Instructions    Call MD for:  difficulty breathing, headache or visual disturbances   Complete by: As directed    Call MD for:  extreme fatigue   Complete by: As directed    Call MD for:  hives   Complete by: As directed    Call MD for:  persistant dizziness or light-headedness   Complete by: As directed    Call MD for:  persistant nausea and vomiting   Complete by: As directed    Call MD for:  redness, tenderness, or signs of infection (pain, swelling, redness, odor or green/yellow discharge around incision site)   Complete by: As directed    Call MD for:  severe uncontrolled pain   Complete by: As directed    Call MD for:  temperature >100.4   Complete by: As directed    Diet - low sodium heart  healthy   Complete by: As directed    Discharge instructions   Complete by: As directed    Call [phone] for a followup appointment. Take a stool softener while you are using pain medications.   Driving Restrictions   Complete by: As directed    Do not drive for 2 weeks.   Increase activity slowly   Complete by: As directed    Lifting restrictions   Complete by: As directed    Do not lift more than 5 pounds. No excessive bending or twisting.   May shower / Bathe   Complete by: As directed    Remove the dressing for 3 days after surgery.  You may shower, but leave the incision alone.   Remove dressing in 24 hours   Complete by: As directed      Allergies as of 07/01/2020   No Known Allergies     Medication List    STOP taking these medications   acetaminophen 325 MG tablet Commonly known as: TYLENOL   amphetamine-dextroamphetamine 20 MG tablet Commonly known as: ADDERALL   FLUoxetine 40 MG capsule Commonly known as: PROZAC     TAKE these medications   dexamethasone 4 MG tablet Commonly known as: DECADRON Take 1 tablet (4 mg total) by mouth 3 (three) times daily.   docusate sodium 100 MG capsule Commonly known as: COLACE Take 1 capsule (100 mg total) by mouth 2 (two) times daily.   levETIRAcetam 500 MG tablet  Commonly known as: KEPPRA Take 1 tablet (500 mg total) by mouth 2 (two) times daily.   oxyCODONE-acetaminophen 5-325 MG tablet Commonly known as: PERCOCET/ROXICET Take 1-2 tablets by mouth every 4 (four) hours as needed for moderate pain or severe pain (1 tablet for moderate pain and 1 tablet for severe pain).   pantoprazole 40 MG tablet Commonly known as: PROTONIX Take 1 tablet (40 mg total) by mouth 2 (two) times daily.        Signed: Ophelia Charter 07/01/2020, 7:03 AM

## 2020-07-05 ENCOUNTER — Telehealth: Payer: Self-pay | Admitting: *Deleted

## 2020-07-05 NOTE — Telephone Encounter (Signed)
Referral from Dr. Arnoldo Morale received.  Attempted to reach patient to schedule with Dr. Mickeal Skinner, left message pending call back.

## 2020-07-06 ENCOUNTER — Other Ambulatory Visit: Payer: Self-pay | Admitting: Radiation Therapy

## 2020-07-07 ENCOUNTER — Inpatient Hospital Stay: Payer: Commercial Managed Care - PPO | Attending: Internal Medicine | Admitting: Internal Medicine

## 2020-07-07 ENCOUNTER — Other Ambulatory Visit: Payer: Self-pay

## 2020-07-07 ENCOUNTER — Encounter: Payer: Self-pay | Admitting: Internal Medicine

## 2020-07-07 VITALS — BP 116/68 | HR 72 | Temp 98.1°F | Resp 17 | Ht 71.0 in | Wt 210.8 lb

## 2020-07-07 DIAGNOSIS — C711 Malignant neoplasm of frontal lobe: Secondary | ICD-10-CM | POA: Diagnosis present

## 2020-07-07 DIAGNOSIS — Z7952 Long term (current) use of systemic steroids: Secondary | ICD-10-CM | POA: Diagnosis not present

## 2020-07-07 DIAGNOSIS — Z79899 Other long term (current) drug therapy: Secondary | ICD-10-CM | POA: Diagnosis not present

## 2020-07-07 DIAGNOSIS — F1021 Alcohol dependence, in remission: Secondary | ICD-10-CM | POA: Diagnosis not present

## 2020-07-07 NOTE — Progress Notes (Signed)
[facility] at [place], [place] [address] [phone]   New Patient Evaluation  Date of Service: 07/07/20 Patient Name: [facility] Patient MRN: [id number] Patient DOB: [birth year] Provider: Ventura Sellers, MD  Identifying Statement:  [name] is a 54 y.o. male with left frontal glioblastoma who presents for initial consultation and evaluation.    Referring Provider: Newman Pies, MD [address] [place],  [place] [address]  Oncologic History: 06/29/20: Craniotomy, resection of left frontal mass c/w glioblastoma Arnoldo Morale)  Biomarkers:  MGMT Unknown.  IDH 1/2 Unknown.  EGFR Unknown  TERT Unknown   History of Present Illness: The patient's records from the referring physician were obtained and reviewed and the patient interviewed to confirm this HPI.  Vennie Homans presented to medical attention last week with several weeks of progressive difficulty with expressive language and communication.  These deficits were noted by both the patient and his family.  He maintained ability to speak but only in short interrupted sentences, worsening over time.  In ED, he underwent CNS imaging which identified a large enhancing mass in the left frontal lobe.  Craniotomy and resection was then performed on 06/29/20 by Dr. Arnoldo Morale, with prompt recovery and discharge.  Since surgery, he has modestly improved his speech output, though still struggles with some words and sentences.  Currently on decadron 26m three times per day for the past week, as well as twice per day Keppra.  Denies any seizure or headaches.  [place]  Medications: Current Outpatient Medications on File Prior to Visit  Medication Sig Dispense Refill  . dexamethasone (DECADRON) 4 MG tablet Take 1 tablet (4 mg total) by mouth 3 (three) times daily. 60 tablet 2  . docusate sodium (COLACE) 100 MG  capsule Take 1 capsule (100 mg total) by mouth 2 (two) times daily. 60 capsule 0  . levETIRAcetam (KEPPRA) 500 MG tablet Take 1 tablet (500 mg total) by mouth 2 (two) times daily. 60 tablet 3  . oxyCODONE-acetaminophen (PERCOCET/ROXICET) 5-325 MG tablet Take 1-2 tablets by mouth every 4 (four) hours as needed for moderate pain or severe pain (1 tablet for moderate pain and 1 tablet for severe pain). 30 tablet 0  . pantoprazole (PROTONIX) 40 MG tablet Take 1 tablet (40 mg total) by mouth 2 (two) times daily. 60 tablet 3   No current facility-administered medications on file prior to visit.    Allergies: No Known Allergies Past Medical History:  Past Medical History:  Diagnosis Date  . Arthritis   . Depression    situational   Past Surgical History:  Past Surgical History:  Procedure Laterality Date  . APPLICATION OF CRANIAL NAVIGATION N/A 06/29/2020   Procedure: APPLICATION OF CRANIAL NAVIGATION;  Surgeon: JNewman Pies MD;  [facility]  Service: Neurosurgery;  Laterality: N/A;  . CERVICAL FUSION    . COLONOSCOPY W/ POLYPECTOMY    . CRANIOTOMY Left 06/29/2020   Procedure: Left Frontal CRANIOTOMY TUMOR EXCISION;  Surgeon: JNewman Pies MD;  [facility]  Service: Neurosurgery;  Laterality: Left;  . KNEE ARTHROSCOPY     Social History:  Social History   Socioeconomic History  . Marital status: Married    Spouse name: JCarlito Bogert . Number of children: Not on file  . Years of education: Not on file  . Highest education level: Not on file  Occupational History  . Not on file  Tobacco Use  . Smoking status: Never Smoker  . Smokeless tobacco: Former Network engineer  . Vaping Use: Never used  Substance and Sexual Activity  . Alcohol use: Not Currently    Comment: 06/28/2020- prior to starting medications for brain mass- he drank 56 beers a week  . Drug use: Never  . Sexual activity: Not on file  Other Topics Concern  . Not on file  Social History Narrative   . Not on file   Social Determinants of Health   Financial Resource Strain:   . Difficulty of Paying Living Expenses:   Food Insecurity:   . Worried About Charity fundraiser in the Last Year:   . Arboriculturist in the Last Year:   Transportation Needs:   . Film/video editor (Medical):   [place] Lack of Transportation (Non-Medical):   Physical Activity:   . Days of Exercise per Week:   . Minutes of Exercise per Session:   Stress:   . Feeling of Stress :   Social Connections:   . Frequency of Communication with Friends and Family:   . Frequency of Social Gatherings with Friends and Family:   . Attends Religious Services:   . Active Member of Clubs or Organizations:   . Attends Archivist Meetings:   [place] Marital Status:   Intimate Partner Violence:   . Fear of Current or Ex-Partner:   . Emotionally Abused:   [place] Physically Abused:   . Sexually Abused:    Family History: No family history on file.  Review of Systems: Constitutional: Doesn't report fevers, chills or abnormal weight loss Eyes: Doesn't report blurriness of vision Ears, nose, mouth, throat, and face: Doesn't report sore throat Respiratory: Doesn't report cough, dyspnea or wheezes Cardiovascular: Doesn't report palpitation, chest discomfort  Gastrointestinal:  Doesn't report nausea, constipation, diarrhea GU: Doesn't report incontinence Skin: Doesn't report skin rashes Neurological: Per HPI Musculoskeletal: Doesn't report joint pain Behavioral/Psych: Doesn't report anxiety  Physical Exam: Vitals:   07/07/20 0947  BP: 116/68  Pulse: 72  Resp: 17  Temp: 98.1 F (36.7 C)  SpO2: 98%   KPS: 80. General: Alert, cooperative, pleasant, in no acute distress Head: Normal EENT: No conjunctival injection or scleral icterus.  Lungs: Resp effort normal Cardiac: Regular rate Abdomen: Non-distended abdomen Skin: No rashes cyanosis or petechiae. Extremities: No clubbing or edema  Neurologic  Exam: Mental Status: Awake, alert, attentive to examiner. Oriented to self and environment. Modest expressive dysphasia.  Cranial Nerves: Visual acuity is grossly normal. Visual fields are full. Extra-ocular movements intact. No ptosis. Face is symmetric Motor: Tone and bulk are normal. Power is full in both arms and legs. Reflexes are symmetric, no pathologic reflexes present.  Sensory: Intact to light touch Gait: Normal.   Labs: I have reviewed the data as listed    Component Value Date/Time   NA 139 06/29/2020 1251   K 4.0 06/29/2020 1251   CL 106 06/22/2020 2200   CO2 22 06/22/2020 2200   GLUCOSE 186 (H) 06/22/2020 2200   BUN 10 06/22/2020 2200   CREATININE 0.88 06/22/2020 2200   CALCIUM 10.2 06/22/2020 2200   PROT 6.9 06/22/2020 1042   ALBUMIN 4.0 06/22/2020 1042   AST 19 06/22/2020 1042   ALT 19 06/22/2020 1042   ALKPHOS 67 06/22/2020 1042   BILITOT 0.7 06/22/2020 1042   GFRNONAA >60 06/22/2020 2200   GFRAA >60 06/22/2020 2200   Lab Results  Component Value Date  WBC 7.2 06/22/2020   NEUTROABS 5.2 06/22/2020   HGB 13.6 06/29/2020   HCT 40.0 06/29/2020   MCV 88.7 06/22/2020   PLT 328 06/22/2020    Imaging:  DG Chest 1 View  Result Date: 06/22/2020 CLINICAL DATA:  Altered mental status. Pre MRI. EXAM: CHEST  1 VIEW COMPARISON:  None. FINDINGS: Low lung volumes with vascular crowding and bibasilar atelectasis. The heart is normal in size. No infiltrates or effusions. No worrisome pulmonary lesions. The bony thorax is intact. No metallic foreign bodies are identified. IMPRESSION: Low lung volumes with vascular crowding and bibasilar atelectasis. Electronically Signed   By: Marijo Sanes M.D.   On: 06/22/2020 13:00   DG Abd 1 View  Result Date: 06/22/2020 CLINICAL DATA:  MRI clearance, altered mental status EXAM: ABDOMEN - 1 VIEW COMPARISON:  None FINDINGS: No metallic foreign bodies identified. Nonspecific bowel gas pattern. Osseous structures unremarkable. No  urinary tract calcifications. Small pelvic phleboliths noted. IMPRESSION: No metallic foreign bodies identified. Electronically Signed   By: Lavonia Dana M.D.   On: 06/22/2020 13:05   CT HEAD WO CONTRAST  Result Date: 06/22/2020 CLINICAL DATA:  Neuro deficit, acute, stroke suspected. Additional history provided: Difficulty answering questions, expressive aphasia with unknown onset. EXAM: CT HEAD WITHOUT CONTRAST TECHNIQUE: Contiguous axial images were obtained from the base of the skull through the vertex without intravenous contrast. COMPARISON:  No pertinent prior studies available for comparison. FINDINGS: Brain: There is a large region of vasogenic edema within the mid to anterior left frontal lobe. Centrally within this region of vasogenic edema there is a probable underlying mass measuring 3.8 x 3.5 x 3.5 cm (series 3, image 23) (series 5, image 26). There is resultant mass effect with partial effacement of the anterolateral ventricles. 7 mm rightward midline shift measured at the level of the septum pellucidum. There is rightward subfalcine herniation. There is no acute intracranial hemorrhage. No definite demarcated cortical infarct is identified. No extra-axial fluid collection. No evidence of intracranial mass. No midline shift. Vascular: No hyperdense vessel. Skull: Normal. Negative for fracture or focal lesion. Sinuses/Orbits: Visualized orbits show no acute finding. No significant paranasal sinus disease or mastoid effusion at the imaged levels. These results were called by telephone at the time of interpretation on 06/22/2020 at 12:32 pm to provider Dr. Alvino Chapel, who verbally acknowledged these results. IMPRESSION: Large region of vasogenic edema within the mid to anterior left frontal lobe. Centrally within this region of edema, there is an apparent underlying mass measuring 3.8 cm. Primary differential considerations include metastatic disease versus primary CNS neoplasm. Contrast-enhanced brain  MRI is recommended for further characterization. Associated mass effect with partial effacement of the ventricular system, 7 mm rightward midline shift and rightward subfalcine herniation. Electronically Signed   By: Kellie Simmering DO   On: 06/22/2020 12:35   MR BRAIN W WO CONTRAST  Result Date: 06/30/2020 CLINICAL DATA:  Brain mass post resection EXAM: MRI HEAD WITHOUT AND WITH CONTRAST TECHNIQUE: Multiplanar, multiecho pulse sequences of the brain and surrounding structures were obtained without and with intravenous contrast. CONTRAST:  22m GADAVIST GADOBUTROL 1 MMOL/ML IV SOLN COMPARISON:  06/22/2020 FINDINGS: Brain: There is a new resection cavity in the left frontal lobe containing air, fluid, and blood products at the location of necrotic lesion seen on the preoperative study. Nonspecific curvilinear enhancement is present at the resection cavity margins. Mild reduced diffusion at the resection margins likely reflects postoperative contusion. Extent of abnormal T2 FLAIR hyperintensity in the surrounding frontal lobe  has decreased in extent. There is persistent involvement of the anterior body of the corpus callosum. Mass effect has improved with decreased effacement of the lateral and third ventricles and decreased rightward midline shift. Vascular: Major vessel flow voids at the skull base are preserved. Skull and upper cervical spine: Normal marrow signal is preserved. Sinuses/Orbits: Paranasal sinuses are aerated. Orbits are unremarkable. Other: Sella is unremarkable.  Mastoid air cells are clear. IMPRESSION: Expected postoperative changes of left frontal mass resection. There is gross total resection of the enhancing component with attention on follow-up recommended for probably postoperative curvilinear marginal enhancement. Decreased edema and mass effect. Electronically Signed   By: Macy Mis M.D.   On: 06/30/2020 15:14   MR Brain W and Wo Contrast  Result Date: 06/22/2020 CLINICAL DATA:   Expressive aphasia.  Abnormal head CT. EXAM: MRI HEAD WITHOUT AND WITH CONTRAST TECHNIQUE: Multiplanar, multiecho pulse sequences of the brain and surrounding structures were obtained without and with intravenous contrast. CONTRAST:  9.75m GADAVIST GADOBUTROL 1 MMOL/ML IV SOLN COMPARISON:  Head CT same day. FINDINGS: Brain: Brainstem and cerebellum are normal. Within the left frontal lobe, there is a mass lesion with central necrosis measuring 4.5 cm in diameter. Considerable regional vasogenic edema. Mass effect with left-to-right shift of 9 mm. There is only mild vasogenic edema crossing the midline at the region of the anterior corpus callosum. This lesion could be a malignant primary brain tumor or a metastatic lesion. No second tumor is identified however, which would favor primary brain neoplasm given the size of the lesion. Low level blood products are present within the tumor. Vascular: Major vessels at the base of the brain show flow. Skull and upper cervical spine: Negative Sinuses/Orbits: Clear/normal Other: None IMPRESSION: 4.5 cm in diameter necrotic mass in the left frontal lobe with pronounced regional vasogenic edema. Mass effect with left-to-right shift of 8-9 mm. Low level blood products present within the necrotic tumor. 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 Internal material does not show enough restricted diffusion to suggest brain abscess. Electronically Signed   By: MNelson ChimesM.D.   On: 06/22/2020 16:52    Pathology: SURGICAL PATHOLOGY  CASE: [id number]  PATIENT: MVennie Homans Surgical Pathology Report      Clinical History: brain tumor (cm)      FINAL MICROSCOPIC DIAGNOSIS:   A. BRAIN TUMOR, LEFT FRONTAL, BIOPSY:  - Necrosis.   B. BRAIN TUMOR, LEFT FRONTAL, BIOPSY:  - Necrosis with focal atypical cells.   C. BRAIN TUMOR, LEFT FRONTAL, BIOPSY:  - Necrosis with focal atypical cells.    D. 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   COMMENT:   D. Dr. KVic Ripperhas reviewed the case. The case was discussed with Dr.  VMickeal Skinneron 07/01/20. Testing for IDH1/2 and MGMT will be ordered.     INTRAOPERATIVE DIAGNOSIS:   A. Left frontal brain tumor, biopsy: "Necrosis." Intraoperative  diagnosis rendered by Dr. PSaralyn Pilarat 2:56 PM on 06/29/2020.  B. Left frontal tumor, biopsy: "Necrosis." Intraoperative diagnosis  rendered by Dr. [facility]yn P[facility] 06/29/2020.  C. Left frontal tumor, biopsy: "Necrosis had a few fragments of  lesional tissue." Intraoperative diagnosis rendered by Dr. PSaralyn Pilarat  4:05 PM on 06/29/2020.   GROSS DESCRIPTION:   A. Received in saline is a 2.2 x 2 x 0.4 cm aggregate of tan-pink soft  tissue, entirely frozen and subsequently submitted  in block A1.   B. Received in saline is a 2.5 x 1.8 x 0.4 cm aggregate of tan-pink  soft tissue, entirely frozen and subsequently submitted in block B1.   C. Received in saline is a 1.7 x 1.7 x 0.3 cm aggregate of tan-pink  soft tissue, entirely frozen and subsequently submitted in block C1.   D. Received fresh is a 3.6 x 2.5 x 1 cm aggregate of tan-pink soft  tissue, sectioned and entirely submitted in 3 cassettes.  (AK 06/30/2020)     Final Diagnosis performed by Vicente Males, MD.    Assessment/Plan Frontal glioblastoma [facility]) [C71.1]  We appreciate the opportunity to participate in the care of London Tarnowski.  He is clinically improved following gross tumor resection and corticosteroid therapy.    We extensively discussed his clinical features, imaging findings, pathology, prognosis and treatment pathways moving forward.  Although he understands the gravity of the diagnosis, we are encouraged by his overall health, good functional status, and high quality of resection.   We ultimately recommended proceeding with course of intensity modulated radiation  therapy and concurrent daily Temozolomide.  Radiation will be administered Mon-Fri over 6 weeks, Temodar will be dosed at 35m/m2 to be given daily over 42 days.  We reviewed side effects of temodar, including fatigue, nausea/vomiting, constipation, and cytopenias.  Chemotherapy should be held for the following:  ANC less than 1,000  Platelets less than 100,000  LFT or creatinine greater than 2x ULN  If clinical concerns/contraindications develop  Screening for potential clinical trials was performed and discussed using eligibility criteria for active protocols [facility], as well as national database available on [email or web address]    The patient is a candidate and is interested in pursuing a research protocol.  [place] will most likely be placed for ongoing care, including radiation therapy through neuro-oncologist [facility] [place] [place]  We will defer making arrangements until we hear from the patient confirming preferred location for radiation and further oncologic care.  We recommended decreasing dexamethasone to 463mBID x5 days, then 53m72maily x5 days, then 2mg52mily x5 days, then stopping.  There is considerable edema on both pre and post-op MRIs, as well as ongoing focality which preclude rapid corticosteroid taper.  Keppra can be disctontinued after 14 days post-op if no seizures.  We spent twenty additional minutes teaching regarding the natural history, biology, and historical experience in the treatment of brain tumors. We then discussed in detail the current recommendations for therapy focusing on the mode of administration, mechanism of action, anticipated toxicities, and quality of life issues associated with this plan. We also provided teaching sheets for the patient to take home as an additional resource.  All questions were  answered. The patient knows to call the clinic with any problems, questions or concerns. No barriers to learning were detected.  The total time spent in the encounter was 60 minutes and more than 50% was on counseling and review of test results   ZachVentura Sellers Medical Director of Neuro-Oncology [facility]15/21 3:25 PM

## 2020-07-11 ENCOUNTER — Inpatient Hospital Stay: Payer: Commercial Managed Care - PPO

## 2020-07-11 ENCOUNTER — Encounter (HOSPITAL_COMMUNITY): Payer: Self-pay | Admitting: Internal Medicine

## 2020-07-15 ENCOUNTER — Encounter: Payer: Self-pay | Admitting: Radiation Therapy

## 2020-07-15 ENCOUNTER — Encounter (HOSPITAL_COMMUNITY): Payer: Self-pay | Admitting: Internal Medicine

## 2020-07-15 NOTE — Progress Notes (Signed)
As requested, the brain imaging has been pushed to [place] via PACs and the recent office notes have been faxed to [facility] @ [phone].   Mont Dutton R.T.(R)(T) Radiation Special Procedures Navigator

## 2021-02-22 IMAGING — MR MR HEAD WO/W CM
9 of 13 series · 34 of 48 positions shown · IV contrast (gadavist)
Comparison: 06/22/2020

CLINICAL DATA: Brain mass post resection

EXAM:
MRI HEAD WITHOUT AND WITH CONTRAST
TECHNIQUE: Multiplanar, multiecho pulse sequences of the brain and surrounding
structures were obtained without and with intravenous contrast.
CONTRAST:  9mL GADAVIST GADOBUTROL 1 MMOL/ML IV SOLN

[Series 4: DWI · axial · 3.0mm · 1.09mm/px · z∈[-65,+99]mm · 9 of 112 slices shown (1 of 4)]
[im 1/112]
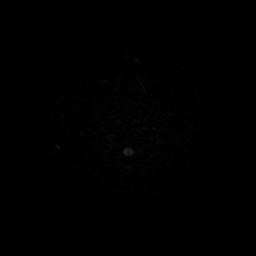
[im 14/112]
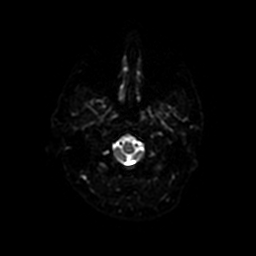
[im 28/112]
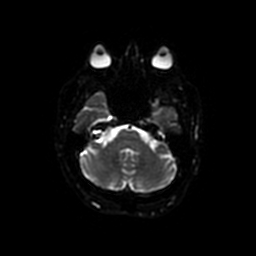
[im 42/112]
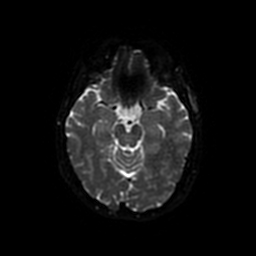
[im 56/112]
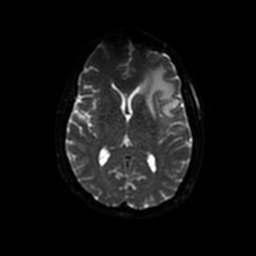
[im 70/112]
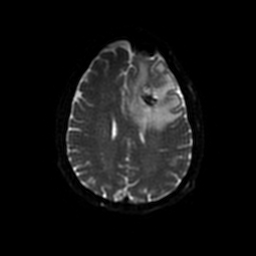
[im 84/112]
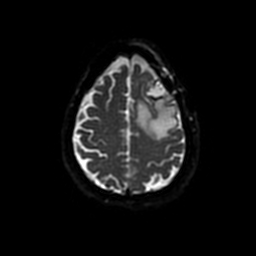
[im 98/112]
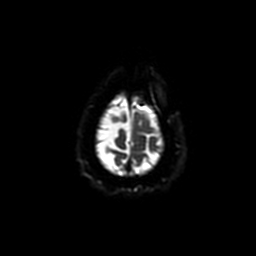
[im 112/112]
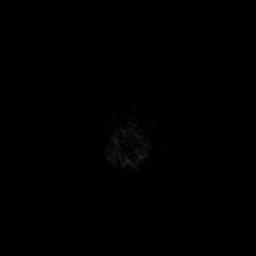

[Series 5: DWI · coronal · 5.0mm · 1.09mm/px · 6 of 80 slices shown (2 of 4)]
[im 1/80]
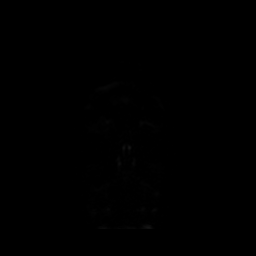
[im 16/80]
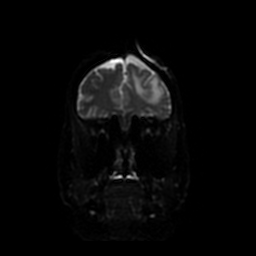
[im 32/80]
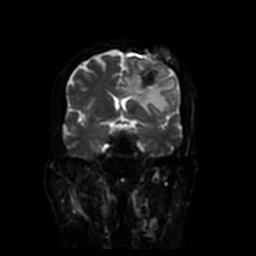
[im 48/80]
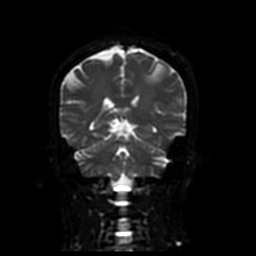
[im 64/80]
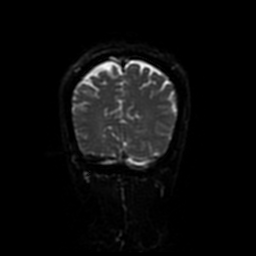
[im 80/80]
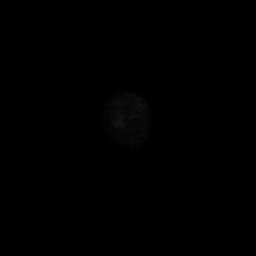

[Series 7: T2 · axial · 5.0mm · 0.45mm/px · z∈[-70,+92]mm · 2 of 28 slices shown]
[im 1/28]
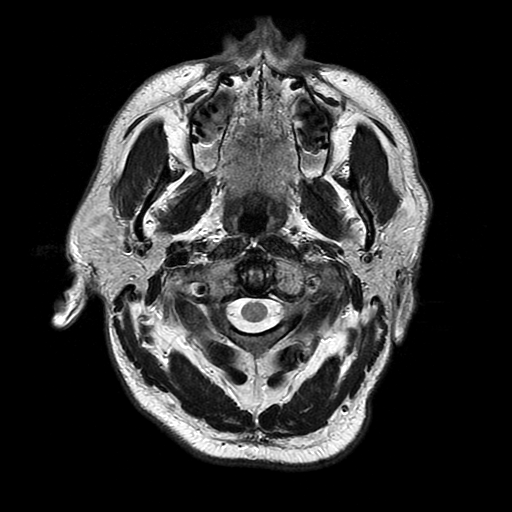
[im 28/28]
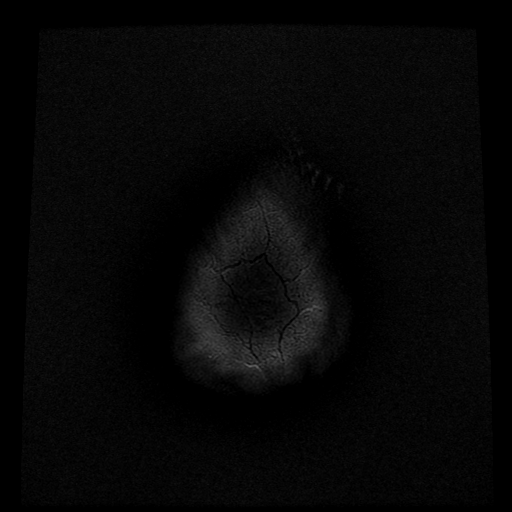

[Series 8: FLAIR · axial · 3.0mm · 0.45mm/px · z∈[-70,+92]mm · 2 of 28 slices shown]
[im 1/28]
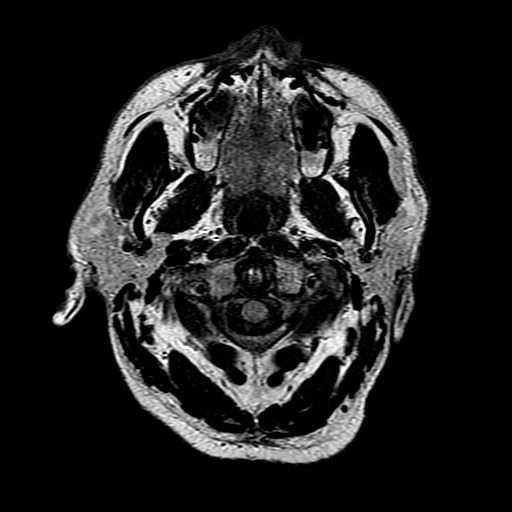
[im 28/28]
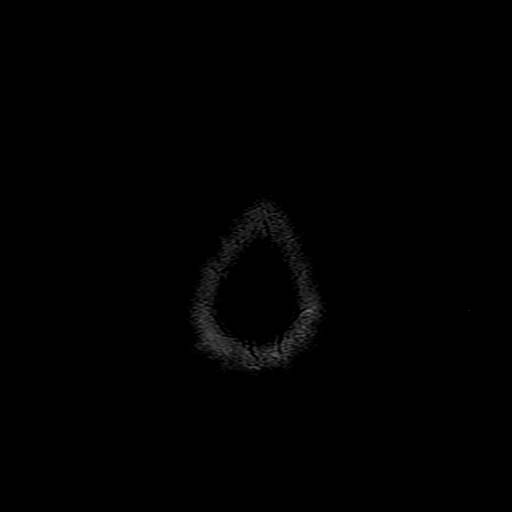

[Series 12: T1 post-contrast · axial · 3.0mm · 0.47mm/px · z∈[-67,+97]mm · 4 of 56 slices shown (1 of 3)]
[im 1/56]
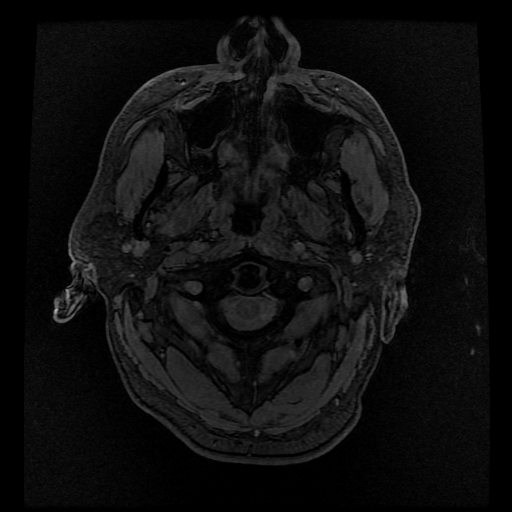
[im 19/56]
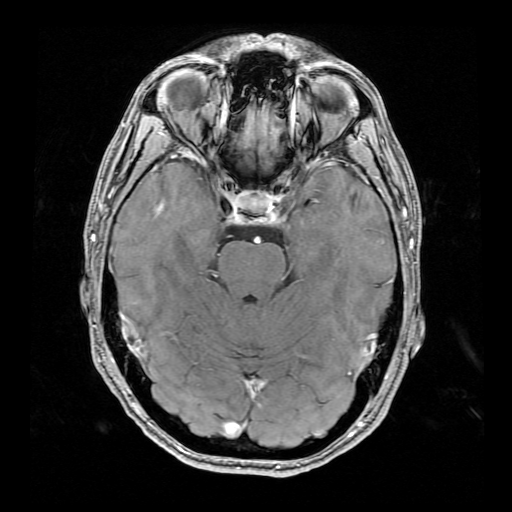
[im 37/56]
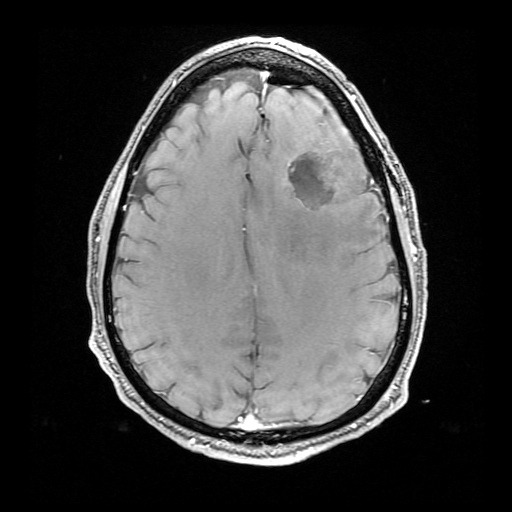
[im 56/56]
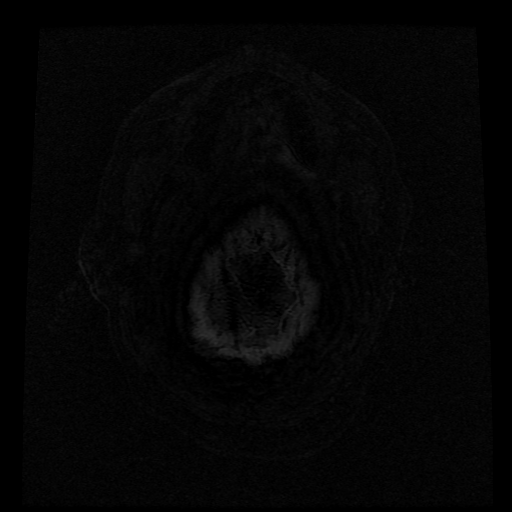

[Series 13: T1 post-contrast · coronal · 5.0mm · 0.39mm/px · 2 of 30 slices shown (2 of 3)]
[im 1/30]
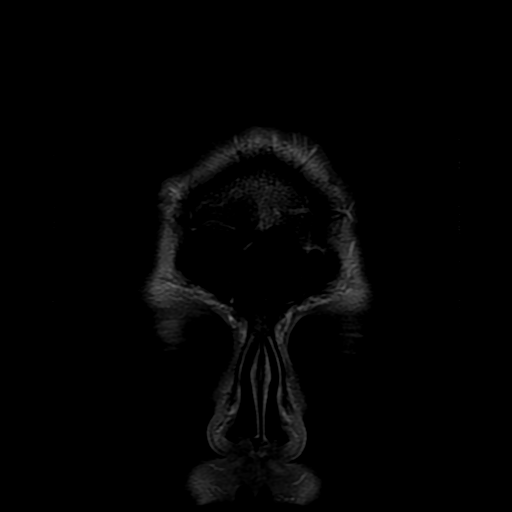
[im 30/30]
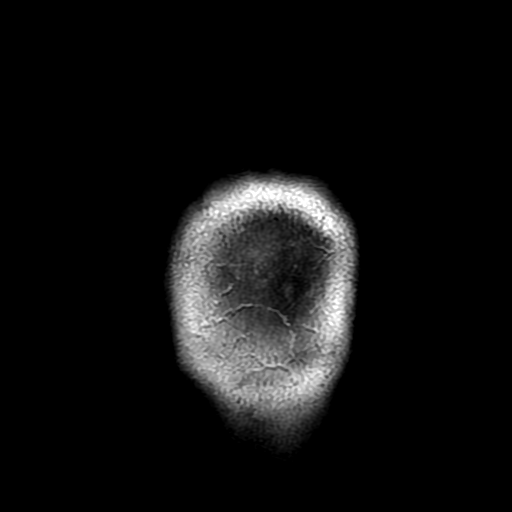

[Series 14: T1 post-contrast · sagittal · 5.0mm · 0.47mm/px · 2 of 26 slices shown (3 of 3)]
[im 1/26]
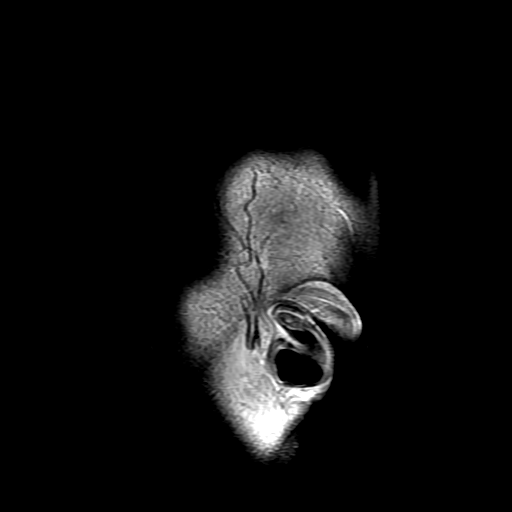
[im 26/26]
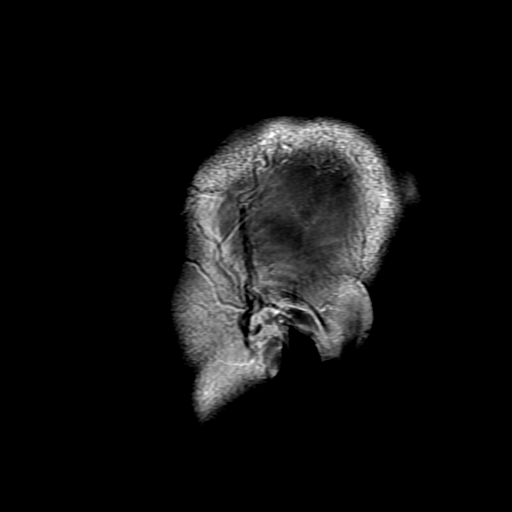

[Series 400: DWI · axial · 3.0mm · 1.09mm/px · z∈[-65,+99]mm · 4 of 56 slices shown (3 of 4)]
[im 1/56]
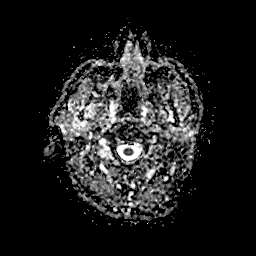
[im 19/56]
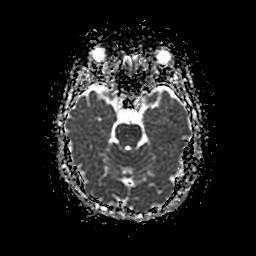
[im 37/56]
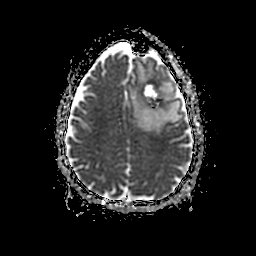
[im 56/56]
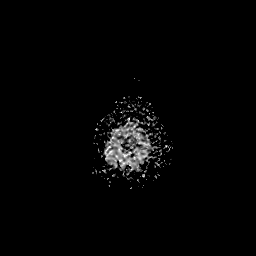

[Series 500: DWI · coronal · 5.0mm · 1.09mm/px · 3 of 40 slices shown (4 of 4)]
[im 1/40]
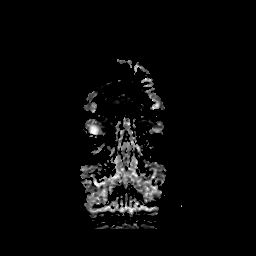
[im 20/40]
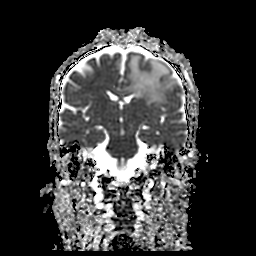
[im 40/40]
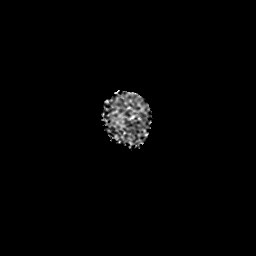

[34 of 48 positions shown; findings below may reference images not displayed]

FINDINGS: Brain: There is a new resection cavity in the left frontal lobe
containing air, fluid, and blood products at the location of
necrotic lesion seen on the preoperative study. Nonspecific
curvilinear enhancement is present at the resection cavity margins.
Mild reduced diffusion at the resection margins likely reflects
postoperative contusion.

Extent of abnormal T2 FLAIR hyperintensity in the surrounding
frontal lobe has decreased in extent. There is persistent
involvement of the anterior body of the corpus callosum. Mass effect
has improved with decreased effacement of the lateral and third
ventricles and decreased rightward midline shift.

Vascular: Major vessel flow voids at the skull base are preserved.

Skull and upper cervical spine: Normal marrow signal is preserved.

Sinuses/Orbits: Paranasal sinuses are aerated. Orbits are
unremarkable.

Other: Sella is unremarkable.  Mastoid air cells are clear.
IMPRESSION: Expected postoperative changes of left frontal mass resection. There
is gross total resection of the enhancing component with attention
on follow-up recommended for probably postoperative curvilinear
marginal enhancement. Decreased edema and mass effect.

## 2021-03-10 ENCOUNTER — Other Ambulatory Visit: Payer: Self-pay | Admitting: Neurosurgery

## 2021-03-15 NOTE — Progress Notes (Signed)
Patient will need covid test DOS due to distance.  Patient lives in [place].

## 2021-03-22 ENCOUNTER — Other Ambulatory Visit: Payer: Self-pay

## 2021-03-22 ENCOUNTER — Encounter (HOSPITAL_COMMUNITY): Payer: Self-pay | Admitting: Neurosurgery

## 2021-03-22 NOTE — Progress Notes (Signed)
PCP - Dr. Cornelious Bryant Cardiologist -   Chest x-ray -  EKG - 06/22/20 Stress Test -  ECHO -  Cardiac Cath -   ERAS Protcol - no - clears until [place] TEST- DOS  Anesthesia review: n/a  -------------  SDW INSTRUCTIONS:  Your procedure is scheduled on 03/23/21. Please report to [facility]tr[facility] "A" at 1 P.M., and check in at the Admitting office. Call this number if you have problems the morning of surgery: [phone]   Remember: Do not eat after midnight the night before your surgery  You may drink clear liquids until 1300 the morning of your surgery.   Clear liquids allowed are: Water, Non-Citrus Juices (without pulp), Carbonated Beverages, Clear Tea, Black Coffee Only, and Gatorade   Medications to take morning of surgery with a sip of water include: dexamethasone (DECADRON) gabapentin (NEURONTIN)  levETIRAcetam (KEPPRA) oxyCODONE-acetaminophen (PERCOCET/ROXICET)  pantoprazole (PROTONIX)  As of today, STOP taking any Aspirin (unless otherwise instructed by your surgeon), Aleve, Naproxen, Ibuprofen, Motrin, Advil, Goody's, BC's, all herbal medications, fish oil, and all vitamins.    The Morning of Surgery Do not wear jewelry Do not wear lotions, powders, colognes, or deodorant Men may shave face and neck. Do not bring valuables to the hospital. [facility] is not responsible for any belongings or valuables. If you are a smoker, DO NOT Smoke 24 hours prior to surgery If you wear a CPAP at night please bring your mask the morning of surgery  Remember that you must have someone to transport you home after your surgery, and remain with you for 24 hours if you are discharged the same day. Please bring cases for contacts, glasses, hearing aids, dentures or bridgework because it cannot be worn into surgery.   Patients discharged the day of surgery will not be allowed to drive home.   Please shower the NIGHT BEFORE SURGERY and the MORNING OF SURGERY with DIAL  Soap. Wear comfortable clothes the morning of surgery. Oral Hygiene is also important to reduce your risk of infection.  Remember - BRUSH YOUR TEETH THE MORNING OF SURGERY WITH YOUR REGULAR TOOTHPASTE  Patient denies shortness of breath, fever, cough and chest pain.

## 2021-03-23 ENCOUNTER — Inpatient Hospital Stay (HOSPITAL_COMMUNITY): Payer: 59 | Admitting: Anesthesiology

## 2021-03-23 ENCOUNTER — Inpatient Hospital Stay (HOSPITAL_COMMUNITY)
Admission: RE | Admit: 2021-03-23 | Discharge: 2021-03-26 | DRG: 025 | Disposition: A | Discharge status: Home / Self Care | Payer: 59 | Attending: Neurosurgery | Admitting: Neurosurgery

## 2021-03-23 ENCOUNTER — Inpatient Hospital Stay (HOSPITAL_COMMUNITY)
Admission: RE | Disposition: A | Discharge status: Home / Self Care | Payer: Self-pay | Source: Home / Self Care | Attending: Neurosurgery

## 2021-03-23 ENCOUNTER — Encounter (HOSPITAL_COMMUNITY): Payer: Self-pay | Admitting: Neurosurgery

## 2021-03-23 DIAGNOSIS — Z87891 Personal history of nicotine dependence: Secondary | ICD-10-CM

## 2021-03-23 DIAGNOSIS — Z7952 Long term (current) use of systemic steroids: Secondary | ICD-10-CM

## 2021-03-23 DIAGNOSIS — Z9221 Personal history of antineoplastic chemotherapy: Secondary | ICD-10-CM | POA: Diagnosis not present

## 2021-03-23 DIAGNOSIS — Z923 Personal history of irradiation: Secondary | ICD-10-CM | POA: Diagnosis not present

## 2021-03-23 DIAGNOSIS — Z20822 Contact with and (suspected) exposure to covid-19: Secondary | ICD-10-CM | POA: Diagnosis present

## 2021-03-23 DIAGNOSIS — Z79899 Other long term (current) drug therapy: Secondary | ICD-10-CM | POA: Diagnosis not present

## 2021-03-23 DIAGNOSIS — C711 Malignant neoplasm of frontal lobe: Principal | ICD-10-CM | POA: Diagnosis present

## 2021-03-23 DIAGNOSIS — G936 Cerebral edema: Secondary | ICD-10-CM | POA: Diagnosis present

## 2021-03-23 DIAGNOSIS — C719 Malignant neoplasm of brain, unspecified: Secondary | ICD-10-CM | POA: Diagnosis present

## 2021-03-23 HISTORY — PX: APPLICATION OF CRANIAL NAVIGATION: SHX6578

## 2021-03-23 HISTORY — PX: CRANIOTOMY: SHX93

## 2021-03-23 LAB — POCT I-STAT 7, (LYTES, BLD GAS, ICA,H+H)
Acid-Base Excess: 0 mmol/L (ref 0.0–2.0)
Bicarbonate: 25.4 mmol/L (ref 20.0–28.0)
Calcium, Ion: 1.33 mmol/L (ref 1.15–1.40)
HCT: 38 % — ABNORMAL LOW (ref 39.0–52.0)
Hemoglobin: 12.9 g/dL — ABNORMAL LOW (ref 13.0–17.0)
O2 Saturation: 98 %
Potassium: 3.8 mmol/L (ref 3.5–5.1)
Sodium: 140 mmol/L (ref 135–145)
TCO2: 27 mmol/L (ref 22–32)
pCO2 arterial: 43 mmHg (ref 32.0–48.0)
pH, Arterial: 7.379 (ref 7.350–7.450)
pO2, Arterial: 110 mmHg — ABNORMAL HIGH (ref 83.0–108.0)

## 2021-03-23 LAB — CBC
HCT: 45.2 % (ref 39.0–52.0)
Hemoglobin: 15.2 g/dL (ref 13.0–17.0)
MCH: 30.3 pg (ref 26.0–34.0)
MCHC: 33.6 g/dL (ref 30.0–36.0)
MCV: 90 fL (ref 80.0–100.0)
Platelets: 257 10*3/uL (ref 150–400)
RBC: 5.02 MIL/uL (ref 4.22–5.81)
RDW: 14.4 % (ref 11.5–15.5)
WBC: 14.6 10*3/uL — ABNORMAL HIGH (ref 4.0–10.5)
nRBC: 0 % (ref 0.0–0.2)

## 2021-03-23 LAB — TYPE AND SCREEN
ABO/RH(D): O POS
Antibody Screen: NEGATIVE

## 2021-03-23 LAB — SARS CORONAVIRUS 2 BY RT PCR (HOSPITAL ORDER, PERFORMED IN ~~LOC~~ HOSPITAL LAB): SARS Coronavirus 2: NEGATIVE

## 2021-03-23 SURGERY — CRANIOTOMY TUMOR EXCISION
Anesthesia: General | Site: Head

## 2021-03-23 MED ORDER — CHLORHEXIDINE GLUCONATE 0.12 % MT SOLN
OROMUCOSAL | Status: AC
  Administered 2021-03-23: 15 mL via OROMUCOSAL
  Filled 2021-03-23: qty 15

## 2021-03-23 MED ORDER — ROCURONIUM BROMIDE 10 MG/ML (PF) SYRINGE
PREFILLED_SYRINGE | INTRAVENOUS | Status: DC | PRN
  Administered 2021-03-23: 20 mg via INTRAVENOUS
  Administered 2021-03-23: 40 mg via INTRAVENOUS
  Administered 2021-03-23: 30 mg via INTRAVENOUS
  Administered 2021-03-23 (×2): 20 mg via INTRAVENOUS
  Administered 2021-03-23: 60 mg via INTRAVENOUS

## 2021-03-23 MED ORDER — PROMETHAZINE HCL 25 MG/ML IJ SOLN
6.2500 mg | INTRAMUSCULAR | Status: DC | PRN
  Administered 2021-03-23: 6.25 mg via INTRAVENOUS

## 2021-03-23 MED ORDER — GABAPENTIN 300 MG PO CAPS
300.0000 mg | ORAL_CAPSULE | Freq: Every day | ORAL | Status: DC
  Administered 2021-03-24 – 2021-03-25 (×2): 300 mg via ORAL
  Filled 2021-03-23 (×3): qty 1

## 2021-03-23 MED ORDER — ACETAMINOPHEN 325 MG PO TABS
650.0000 mg | ORAL_TABLET | ORAL | Status: DC | PRN
  Administered 2021-03-24: 650 mg via ORAL
  Filled 2021-03-23: qty 2

## 2021-03-23 MED ORDER — DEXAMETHASONE SODIUM PHOSPHATE 10 MG/ML IJ SOLN
INTRAMUSCULAR | Status: AC
  Filled 2021-03-23: qty 2

## 2021-03-23 MED ORDER — PROPOFOL 10 MG/ML IV BOLUS
INTRAVENOUS | Status: AC
  Filled 2021-03-23: qty 20

## 2021-03-23 MED ORDER — AMISULPRIDE (ANTIEMETIC) 5 MG/2ML IV SOLN
INTRAVENOUS | Status: AC
  Filled 2021-03-23: qty 2

## 2021-03-23 MED ORDER — PROPOFOL 10 MG/ML IV BOLUS
INTRAVENOUS | Status: DC | PRN
  Administered 2021-03-23: 200 mg via INTRAVENOUS
  Administered 2021-03-23: 75 mg via INTRAVENOUS

## 2021-03-23 MED ORDER — PHENYLEPHRINE HCL (PRESSORS) 10 MG/ML IV SOLN
INTRAVENOUS | Status: DC | PRN
  Administered 2021-03-23 (×2): 40 ug via INTRAVENOUS

## 2021-03-23 MED ORDER — DEXAMETHASONE SODIUM PHOSPHATE 10 MG/ML IJ SOLN
INTRAMUSCULAR | Status: DC | PRN
  Administered 2021-03-23: 10 mg via INTRAVENOUS

## 2021-03-23 MED ORDER — LEVETIRACETAM 500 MG PO TABS: 500.0000 mg | ORAL_TABLET | Freq: Two times a day (BID) | ORAL | Status: DC

## 2021-03-23 MED ORDER — BACITRACIN ZINC 500 UNIT/GM EX OINT
TOPICAL_OINTMENT | CUTANEOUS | Status: DC | PRN
  Administered 2021-03-23: 1 via TOPICAL

## 2021-03-23 MED ORDER — ONDANSETRON HCL 4 MG/2ML IJ SOLN
4.0000 mg | INTRAMUSCULAR | Status: DC | PRN
  Administered 2021-03-24 (×2): 4 mg via INTRAVENOUS
  Filled 2021-03-23 (×2): qty 2

## 2021-03-23 MED ORDER — BUPIVACAINE-EPINEPHRINE 0.5% -1:200000 IJ SOLN
INTRAMUSCULAR | Status: DC | PRN
  Administered 2021-03-23: 10 mL

## 2021-03-23 MED ORDER — FENTANYL CITRATE (PF) 100 MCG/2ML IJ SOLN
INTRAMUSCULAR | Status: DC | PRN
  Administered 2021-03-23: 25 ug via INTRAVENOUS
  Administered 2021-03-23: 100 ug via INTRAVENOUS
  Administered 2021-03-23: 150 ug via INTRAVENOUS
  Administered 2021-03-23: 100 ug via INTRAVENOUS

## 2021-03-23 MED ORDER — MIDAZOLAM HCL 2 MG/2ML IJ SOLN
INTRAMUSCULAR | Status: AC
  Filled 2021-03-23: qty 2

## 2021-03-23 MED ORDER — FENTANYL CITRATE (PF) 100 MCG/2ML IJ SOLN: 25.0000 ug | INTRAMUSCULAR | Status: DC | PRN

## 2021-03-23 MED ORDER — CEFAZOLIN SODIUM-DEXTROSE 2-4 GM/100ML-% IV SOLN
2.0000 g | Freq: Three times a day (TID) | INTRAVENOUS | Status: AC
  Administered 2021-03-24 (×2): 2 g via INTRAVENOUS
  Filled 2021-03-23 (×2): qty 100

## 2021-03-23 MED ORDER — LIDOCAINE 2% (20 MG/ML) 5 ML SYRINGE
INTRAMUSCULAR | Status: DC | PRN
  Administered 2021-03-23: 80 mg via INTRAVENOUS

## 2021-03-23 MED ORDER — CHLORHEXIDINE GLUCONATE CLOTH 2 % EX PADS: 6.0000 | MEDICATED_PAD | Freq: Once | CUTANEOUS | Status: DC

## 2021-03-23 MED ORDER — DEXAMETHASONE SODIUM PHOSPHATE 4 MG/ML IJ SOLN
4.0000 mg | Freq: Three times a day (TID) | INTRAMUSCULAR | Status: DC
  Administered 2021-03-26 (×2): 4 mg via INTRAVENOUS
  Filled 2021-03-23 (×2): qty 1

## 2021-03-23 MED ORDER — AMISULPRIDE (ANTIEMETIC) 5 MG/2ML IV SOLN
10.0000 mg | Freq: Once | INTRAVENOUS | Status: AC | PRN
  Administered 2021-03-23: 10 mg via INTRAVENOUS

## 2021-03-23 MED ORDER — MIDAZOLAM HCL 5 MG/5ML IJ SOLN
INTRAMUSCULAR | Status: DC | PRN
  Administered 2021-03-23: 2 mg via INTRAVENOUS

## 2021-03-23 MED ORDER — BUPIVACAINE-EPINEPHRINE 0.5% -1:200000 IJ SOLN
INTRAMUSCULAR | Status: AC
  Filled 2021-03-23: qty 1

## 2021-03-23 MED ORDER — PHENYLEPHRINE HCL-NACL 10-0.9 MG/250ML-% IV SOLN
INTRAVENOUS | Status: DC | PRN
  Administered 2021-03-23: 30 ug/min via INTRAVENOUS

## 2021-03-23 MED ORDER — DEXAMETHASONE SODIUM PHOSPHATE 10 MG/ML IJ SOLN
6.0000 mg | Freq: Four times a day (QID) | INTRAMUSCULAR | Status: AC
  Administered 2021-03-24 (×4): 6 mg via INTRAVENOUS
  Filled 2021-03-23 (×4): qty 1

## 2021-03-23 MED ORDER — BACITRACIN ZINC 500 UNIT/GM EX OINT
TOPICAL_OINTMENT | CUTANEOUS | Status: AC
  Filled 2021-03-23: qty 28.35

## 2021-03-23 MED ORDER — ESMOLOL HCL 100 MG/10ML IV SOLN
INTRAVENOUS | Status: AC
  Filled 2021-03-23: qty 10

## 2021-03-23 MED ORDER — LABETALOL HCL 5 MG/ML IV SOLN
10.0000 mg | INTRAVENOUS | Status: DC | PRN
Start: 2021-03-23 — End: 2021-03-26

## 2021-03-23 MED ORDER — FENTANYL CITRATE (PF) 250 MCG/5ML IJ SOLN
INTRAMUSCULAR | Status: AC
  Filled 2021-03-23: qty 5

## 2021-03-23 MED ORDER — SODIUM CHLORIDE 0.9 % IV SOLN: INTRAVENOUS | Status: DC | PRN

## 2021-03-23 MED ORDER — POTASSIUM CHLORIDE IN NACL 20-0.9 MEQ/L-% IV SOLN
INTRAVENOUS | Status: DC
  Filled 2021-03-23 (×2): qty 1000

## 2021-03-23 MED ORDER — SODIUM CHLORIDE 0.9 % IV SOLN: INTRAVENOUS | Status: DC

## 2021-03-23 MED ORDER — LEVETIRACETAM IN NACL 500 MG/100ML IV SOLN
500.0000 mg | Freq: Once | INTRAVENOUS | Status: AC
  Administered 2021-03-23: 500 mg via INTRAVENOUS
  Filled 2021-03-23: qty 100

## 2021-03-23 MED ORDER — PROMETHAZINE HCL 12.5 MG PO TABS
12.5000 mg | ORAL_TABLET | ORAL | Status: DC | PRN
  Filled 2021-03-23: qty 2

## 2021-03-23 MED ORDER — THROMBIN 20000 UNITS EX KIT
PACK | CUTANEOUS | Status: AC
  Filled 2021-03-23: qty 1

## 2021-03-23 MED ORDER — CEFAZOLIN SODIUM-DEXTROSE 2-4 GM/100ML-% IV SOLN
2.0000 g | INTRAVENOUS | Status: AC
  Administered 2021-03-23: 2 g via INTRAVENOUS
  Filled 2021-03-23: qty 100

## 2021-03-23 MED ORDER — ONDANSETRON HCL 4 MG/2ML IJ SOLN
INTRAMUSCULAR | Status: AC
  Filled 2021-03-23: qty 4

## 2021-03-23 MED ORDER — THROMBIN 20000 UNITS EX SOLR: CUTANEOUS | Status: DC | PRN

## 2021-03-23 MED ORDER — DEXAMETHASONE SODIUM PHOSPHATE 4 MG/ML IJ SOLN
4.0000 mg | Freq: Four times a day (QID) | INTRAMUSCULAR | Status: AC
  Administered 2021-03-25 (×4): 4 mg via INTRAVENOUS
  Filled 2021-03-23 (×4): qty 1

## 2021-03-23 MED ORDER — PANTOPRAZOLE SODIUM 40 MG PO TBEC
40.0000 mg | DELAYED_RELEASE_TABLET | Freq: Every day | ORAL | Status: DC
  Administered 2021-03-24 – 2021-03-26 (×3): 40 mg via ORAL
  Filled 2021-03-23 (×3): qty 1

## 2021-03-23 MED ORDER — SUGAMMADEX SODIUM 200 MG/2ML IV SOLN
INTRAVENOUS | Status: DC | PRN
  Administered 2021-03-23: 200 mg via INTRAVENOUS

## 2021-03-23 MED ORDER — ONDANSETRON HCL 4 MG/2ML IJ SOLN
INTRAMUSCULAR | Status: DC | PRN
  Administered 2021-03-23: 4 mg via INTRAVENOUS

## 2021-03-23 MED ORDER — DOCUSATE SODIUM 100 MG PO CAPS
100.0000 mg | ORAL_CAPSULE | Freq: Two times a day (BID) | ORAL | Status: DC
  Administered 2021-03-24 – 2021-03-26 (×5): 100 mg via ORAL
  Filled 2021-03-23 (×5): qty 1

## 2021-03-23 MED ORDER — CHLORHEXIDINE GLUCONATE 0.12 % MT SOLN: 15.0000 mL | Freq: Once | OROMUCOSAL | Status: AC

## 2021-03-23 MED ORDER — MICROFIBRILLAR COLL HEMOSTAT EX POWD
CUTANEOUS | Status: AC
  Filled 2021-03-23: qty 5

## 2021-03-23 MED ORDER — PROMETHAZINE HCL 25 MG/ML IJ SOLN
INTRAMUSCULAR | Status: AC
  Filled 2021-03-23: qty 1

## 2021-03-23 MED ORDER — GABAPENTIN 100 MG PO CAPS
100.0000 mg | ORAL_CAPSULE | Freq: Every day | ORAL | Status: DC
  Administered 2021-03-24 – 2021-03-26 (×3): 100 mg via ORAL
  Filled 2021-03-23 (×3): qty 1

## 2021-03-23 MED ORDER — ACETAMINOPHEN 650 MG RE SUPP: 650.0000 mg | RECTAL | Status: DC | PRN

## 2021-03-23 MED ORDER — ONDANSETRON HCL 4 MG PO TABS: 4.0000 mg | ORAL_TABLET | ORAL | Status: DC | PRN

## 2021-03-23 MED ORDER — 0.9 % SODIUM CHLORIDE (POUR BTL) OPTIME
TOPICAL | Status: DC | PRN
  Administered 2021-03-23 (×3): 1000 mL

## 2021-03-23 MED ORDER — OXYCODONE-ACETAMINOPHEN 5-325 MG PO TABS
1.0000 | ORAL_TABLET | ORAL | Status: DC | PRN
  Administered 2021-03-24: 1 via ORAL
  Administered 2021-03-24 – 2021-03-26 (×8): 2 via ORAL
  Filled 2021-03-23 (×8): qty 2
  Filled 2021-03-23: qty 1

## 2021-03-23 MED ORDER — ESMOLOL HCL 100 MG/10ML IV SOLN
INTRAVENOUS | Status: DC | PRN
  Administered 2021-03-23: 20 mg via INTRAVENOUS

## 2021-03-23 MED ORDER — HEMOSTATIC AGENTS (NO CHARGE) OPTIME
TOPICAL | Status: DC | PRN
  Administered 2021-03-23: 1 via TOPICAL

## 2021-03-23 MED ORDER — MORPHINE SULFATE (PF) 2 MG/ML IV SOLN
2.0000 mg | INTRAVENOUS | Status: DC | PRN
  Administered 2021-03-24 – 2021-03-25 (×4): 2 mg via INTRAVENOUS
  Filled 2021-03-23 (×4): qty 1

## 2021-03-23 MED ORDER — ORAL CARE MOUTH RINSE: 15.0000 mL | Freq: Once | OROMUCOSAL | Status: AC

## 2021-03-23 SURGICAL SUPPLY — 82 items
BAND RUBBER #18 3X1/16 STRL (MISCELLANEOUS) ×6 IMPLANT
BATTERY IQ STERILE (MISCELLANEOUS) ×3 IMPLANT
BIT DRILL WIRE PASS 1.3MM (BIT) IMPLANT
BLADE CLIPPER SPEC (BLADE) IMPLANT
BLADE ULTRA TIP 2M (BLADE) IMPLANT
BUR PRECISION FLUTE 6.0 (BURR) ×3 IMPLANT
BUR SPIRAL ROUTER 2.3 (BUR) IMPLANT
CANISTER SUCT 3000ML PPV (MISCELLANEOUS) ×6 IMPLANT
CARTRIDGE OIL MAESTRO DRILL (MISCELLANEOUS) ×2 IMPLANT
CATH VENTRIC 35X38 W/TROCAR LG (CATHETERS) IMPLANT
CLIP VESOCCLUDE MED 6/CT (CLIP) IMPLANT
CNTNR URN SCR LID CUP LEK RST (MISCELLANEOUS) ×2 IMPLANT
CONT SPEC 4OZ STRL OR WHT (MISCELLANEOUS) ×3
COVER BACK TABLE 60X90IN (DRAPES) IMPLANT
COVER WAND RF STERILE (DRAPES) IMPLANT
DIFFUSER DRILL AIR PNEUMATIC (MISCELLANEOUS) ×3 IMPLANT
DRAIN JACKSON PRATT 10MM FLAT (MISCELLANEOUS) ×3 IMPLANT
DRAPE MICROSCOPE LEICA (MISCELLANEOUS) ×3 IMPLANT
DRAPE NEUROLOGICAL W/INCISE (DRAPES) ×3 IMPLANT
DRAPE SURG 17X23 STRL (DRAPES) IMPLANT
DRAPE WARM FLUID 44X44 (DRAPES) ×3 IMPLANT
DRILL WIRE PASS 1.3MM (BIT)
ELECT REM PT RETURN 9FT ADLT (ELECTROSURGICAL) ×3
ELECTRODE REM PT RTRN 9FT ADLT (ELECTROSURGICAL) ×2 IMPLANT
EVACUATOR 1/8 PVC DRAIN (DRAIN) IMPLANT
EVACUATOR SILICONE 100CC (DRAIN) ×3 IMPLANT
FORCEPS BIPOLAR SPETZLER 8 1.0 (NEUROSURGERY SUPPLIES) ×3 IMPLANT
GAUZE 4X4 16PLY RFD (DISPOSABLE) IMPLANT
GAUZE SPONGE 4X4 12PLY STRL (GAUZE/BANDAGES/DRESSINGS) ×3 IMPLANT
GAUZE SPONGE 4X4 12PLY STRL LF (GAUZE/BANDAGES/DRESSINGS) ×3 IMPLANT
GLOVE BIO SURGEON STRL SZ8 (GLOVE) ×6 IMPLANT
GLOVE BIO SURGEON STRL SZ8.5 (GLOVE) ×6 IMPLANT
GLOVE EXAM NITRILE XL STR (GLOVE) IMPLANT
GLOVE SRG 8 PF TXTR STRL LF DI (GLOVE) ×2 IMPLANT
GLOVE SURG POLYISO LF SZ8 (GLOVE) ×3 IMPLANT
GLOVE SURG UNDER POLY LF SZ8 (GLOVE) ×3
GLOVE SURG UNDER POLY LF SZ8.5 (GLOVE) ×3 IMPLANT
GOWN STRL REUS W/ TWL LRG LVL3 (GOWN DISPOSABLE) IMPLANT
GOWN STRL REUS W/ TWL XL LVL3 (GOWN DISPOSABLE) ×4 IMPLANT
GOWN STRL REUS W/TWL LRG LVL3 (GOWN DISPOSABLE)
GOWN STRL REUS W/TWL XL LVL3 (GOWN DISPOSABLE) ×6
HEMOSTAT SURGICEL 2X14 (HEMOSTASIS) ×3 IMPLANT
KIT BASIN OR (CUSTOM PROCEDURE TRAY) ×3 IMPLANT
KIT DRAIN CSF ACCUDRAIN (MISCELLANEOUS) IMPLANT
KIT TURNOVER KIT B (KITS) ×3 IMPLANT
MARKER SKIN DUAL TIP RULER LAB (MISCELLANEOUS) ×6 IMPLANT
MARKER SPHERE PSV REFLC 13MM (MARKER) ×6 IMPLANT
NEEDLE HYPO 22GX1.5 SAFETY (NEEDLE) ×3 IMPLANT
NS IRRIG 1000ML POUR BTL (IV SOLUTION) ×3 IMPLANT
OIL CARTRIDGE MAESTRO DRILL (MISCELLANEOUS) ×3
PACK CRANIOTOMY CUSTOM (CUSTOM PROCEDURE TRAY) ×3 IMPLANT
PAD ARMBOARD 7.5X6 YLW CONV (MISCELLANEOUS) ×15 IMPLANT
PATTIES SURGICAL .25X.25 (GAUZE/BANDAGES/DRESSINGS) IMPLANT
PATTIES SURGICAL .5 X.5 (GAUZE/BANDAGES/DRESSINGS) IMPLANT
PATTIES SURGICAL .5 X3 (DISPOSABLE) IMPLANT
PATTIES SURGICAL 1X1 (DISPOSABLE) IMPLANT
PIN MAYFIELD SKULL DISP (PIN) ×3 IMPLANT
SCREW UNIII AXS SD 1.5X4 (Screw) ×9 IMPLANT
SET CARTRIDGE AND TUBING (SET/KITS/TRAYS/PACK) ×3 IMPLANT
SPECIMEN JAR SMALL (MISCELLANEOUS) ×6 IMPLANT
SPONGE NEURO XRAY DETECT 1X3 (DISPOSABLE) IMPLANT
SPONGE SURGIFOAM ABS GEL 100 (HEMOSTASIS) ×3 IMPLANT
STAPLER SKIN PROX WIDE 3.9 (STAPLE) ×3 IMPLANT
STOCKINETTE 6  STRL (DRAPES)
STOCKINETTE 6 STRL (DRAPES) IMPLANT
SUT ETHILON 3 0 FSL (SUTURE) IMPLANT
SUT ETHILON 3 0 PS 1 (SUTURE) IMPLANT
SUT NURALON 4 0 TR CR/8 (SUTURE) ×6 IMPLANT
SUT PROLENE 6 0 BV (SUTURE) IMPLANT
SUT SILK 0 TIES 10X30 (SUTURE) IMPLANT
SUT VIC AB 2-0 CP2 18 (SUTURE) ×6 IMPLANT
SUT VIC AB 3-0 FS2 27 (SUTURE) IMPLANT
SUT VICRYL 4-0 PS2 18IN ABS (SUTURE) IMPLANT
TIP STANDARD 36KHZ (INSTRUMENTS) ×3
TIP STD 36KHZ (INSTRUMENTS) ×2 IMPLANT
TOWEL GREEN STERILE (TOWEL DISPOSABLE) ×3 IMPLANT
TOWEL GREEN STERILE FF (TOWEL DISPOSABLE) ×3 IMPLANT
TRAY FOLEY MTR SLVR 16FR STAT (SET/KITS/TRAYS/PACK) IMPLANT
TUBE CONNECTING 12X1/4 (SUCTIONS) ×3 IMPLANT
UNDERPAD 30X36 HEAVY ABSORB (UNDERPADS AND DIAPERS) IMPLANT
WATER STERILE IRR 1000ML POUR (IV SOLUTION) ×3 IMPLANT
WRENCH TORQUE 36KHZ (INSTRUMENTS) ×3 IMPLANT

## 2021-03-23 NOTE — H&P (Signed)
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  The patient did well after that surgery.  His postoperative scans look good.  He was treated with radiation therapy and chemotherapy.  An interim follow-up scan look good.  Unfortunately his most recent follow-up scan demonstrated a recurrence of his tumor.  I discussed the various treatment options with him.  He has decided to proceed with surgery for resection/debulking of his tumor.  Past Medical History:  Diagnosis Date  . Arthritis   . Depression    situational    Past Surgical History:  Procedure Laterality Date  . APPLICATION OF CRANIAL NAVIGATION N/A 06/29/2020   Procedure: APPLICATION OF CRANIAL NAVIGATION;  Surgeon: Newman Pies, MD;  Location: [facility];  Service: Neurosurgery;  Laterality: N/A;  . CERVICAL FUSION    . COLONOSCOPY W/ POLYPECTOMY    . CRANIOTOMY Left 06/29/2020   Procedure: Left Frontal CRANIOTOMY TUMOR EXCISION;  Surgeon: Newman Pies, MD;  Location: [facility];  Service: Neurosurgery;  Laterality: Left;  . KNEE ARTHROSCOPY      No Known Allergies  Social History   Tobacco Use  . Smoking status: Never Smoker  . Smokeless tobacco: Former Network engineer Use Topics  . Alcohol use: Not Currently    Comment: 06/28/2020- prior to starting medications for brain mass- he drank 56 beers a week    History reviewed. No pertinent family history. Prior to Admission medications   Medication Sig Start Date End Date Taking? Authorizing Provider  dexamethasone (DECADRON) 4 MG tablet Take 1 tablet (4 mg total) by mouth 3 (three) times daily. Patient taking differently: Take 4 mg by mouth 2 (two) times daily. Morning & lunch 06/23/20  Yes Kristeen Miss, MD  gabapentin (NEURONTIN) 100 MG capsule Take 100 mg by mouth in the morning. 03/06/21  Yes [provider]  gabapentin (NEURONTIN) 300 MG capsule  Take 300 mg by mouth at bedtime. 02/18/21  Yes [provider]  levETIRAcetam (KEPPRA) 500 MG tablet Take 1 tablet (500 mg total) by mouth 2 (two) times daily. 06/23/20  Yes Kristeen Miss, MD  oxyCODONE-acetaminophen (PERCOCET/ROXICET) 5-325 MG tablet Take 1-2 tablets by mouth every 4 (four) hours as needed for moderate pain or severe pain (1 tablet for moderate pain and 1 tablet for severe pain). 07/01/20  Yes Newman Pies, MD  zolpidem (AMBIEN) 10 MG tablet Take 10 mg by mouth at bedtime. 01/02/21  Yes [provider]  pantoprazole (PROTONIX) 40 MG tablet Take 1 tablet (40 mg total) by mouth 2 (two) times daily. Patient taking differently: Take 40 mg by mouth in the morning. 06/23/20   Kristeen Miss, MD     Review of Systems  Positive ROS: As above  All other systems have been reviewed and were otherwise negative with the exception of those mentioned in the HPI and as above.  Objective: Vital signs in last 24 hours: Temp:  [98.1 F (36.7 C)] 98.1 F (36.7 C) (03/31 1310) Pulse Rate:  [50] 50 (03/31 1310) Resp:  [17] 17 (03/31 1310) BP: (138)/(98) 138/98 (03/31 1310) SpO2:  [95 %] 95 % (03/31 1310) Weight:  [99.8 kg] 99.8 kg (03/31 1310) Estimated body mass index is 30.68 kg/m as calculated from the following:   Height as of this encounter: 5\' 11"  (1.803 m).   Weight as of this encounter: 99.8 kg.   General Appearance: Alert Head:  Normocephalic, the patient's left craniotomy incision is well-healed. Eyes: PERRL, conjunctiva/corneas clear, EOM's intact,    Ears: Normal  Throat: Normal  Neck: Supple, Back: unremarkable Lungs: Clear to auscultation bilaterally, respirations unlabored Heart: Regular rate and rhythm, no murmur, rub or gallop Abdomen: Soft, non-tender Extremities: Extremities normal, atraumatic, no cyanosis or edema Skin: unremarkable  NEUROLOGIC:   Mental status: alert and oriented,Motor Exam - grossly normal Sensory Exam - grossly  normal Reflexes:  Coordination - grossly normal Gait - grossly normal Balance - grossly normal Cranial Nerves: I: smell Not tested  II: visual acuity  OS: Normal  OD: Normal   II: visual fields Full to confrontation  II: pupils Equal, round, reactive to light  III,VII: ptosis None  III,IV,VI: extraocular muscles  Full ROM  V: mastication Normal  V: facial light touch sensation  Normal  V,VII: corneal reflex  Present  VII: facial muscle function - upper  Normal  VII: facial muscle function - lower Normal  VIII: hearing Not tested  IX: soft palate elevation  Normal  IX,X: gag reflex Present  XI: trapezius strength  5/5  XI: sternocleidomastoid strength 5/5  XI: neck flexion strength  5/5  XII: tongue strength  Normal    Data Review Lab Results  Component Value Date   WBC 14.6 (H) 03/23/2021   HGB 15.2 03/23/2021   HCT 45.2 03/23/2021   MCV 90.0 03/23/2021   PLT 257 03/23/2021   Lab Results  Component Value Date   NA 139 06/29/2020   K 4.0 06/29/2020   CL 106 06/22/2020   CO2 22 06/22/2020   BUN 10 06/22/2020   CREATININE 0.88 06/22/2020   GLUCOSE 186 (H) 06/22/2020   Lab Results  Component Value Date   INR 1.0 06/22/2020    Assessment/Plan: Recurrent brain tumor: I have discussed the situation with the patient.  We have discussed the various treatment options including a repeat craniotomy for resection/debulking of his tumor.  I described that surgery to him.  We have discussed the risk, benefits, alternatives, expected postoperative course, and likelihood of achieving our goals with surgery.  I have answered all his questions.  He has decided to proceed with surgery.   Ophelia Charter 03/23/2021 4:51 PM

## 2021-03-23 NOTE — Anesthesia Procedure Notes (Signed)
Procedure Name: Intubation Date/Time: 03/23/2021 6:47 PM Performed by: Eligha Bridegroom, CRNA Pre-anesthesia Checklist: Patient identified, Emergency Drugs available, Suction available, Patient being monitored and Timeout performed Patient Re-evaluated:Patient Re-evaluated prior to induction Oxygen Delivery Method: Circle system utilized Preoxygenation: Pre-oxygenation with 100% oxygen Induction Type: IV induction Ventilation: Mask ventilation without difficulty Laryngoscope Size: Mac and 4 Grade View: Grade II Tube type: Oral Tube size: 8.0 mm Number of attempts: 1 Airway Equipment and Method: Stylet Placement Confirmation: ETT inserted through vocal cords under direct vision,  positive ETCO2 and breath sounds checked- equal and bilateral Secured at: 22 cm Tube secured with: Tape Dental Injury: Teeth and Oropharynx as per pre-operative assessment

## 2021-03-23 NOTE — Transfer of Care (Signed)
Immediate Anesthesia Transfer of Care Note  Patient: Robert Espinoza  Procedure(s) Performed: Stereotactic CRANIOTOMY for  TUMOR EXCISION, Left (N/A Head) APPLICATION OF CRANIAL NAVIGATION (N/A )  Patient Location: PACU  Anesthesia Type:General  Level of Consciousness: drowsy  Airway & Oxygen Therapy: Patient Spontanous Breathing and Patient connected to face mask oxygen  Post-op Assessment: Report given to RN and Post -op Vital signs reviewed and stable  Post vital signs: Reviewed and stable  Last Vitals:  Vitals Value Taken Time  BP 146/98   Temp    Pulse 76   Resp 16   SpO2 96     Last Pain:  Vitals:   03/23/21 1341  TempSrc:   PainSc: 0-No pain         Complications: No complications documented.

## 2021-03-23 NOTE — Progress Notes (Signed)
Subjective: The patient is somnolent but arousable.  He is in no apparent distress.  Objective: Vital signs in last 24 hours: Temp:  [97.9 F (36.6 C)-98.1 F (36.7 C)] 97.9 F (36.6 C) (03/31 2245) Pulse Rate:  [50-73] 73 (03/31 2245) Resp:  [14-17] 14 (03/31 2245) BP: (138-146)/(98) 146/98 (03/31 2245) SpO2:  [95 %-96 %] 96 % (03/31 2245) Arterial Line BP: (185)/(96) 185/96 (03/31 2245) Weight:  [99.8 kg] 99.8 kg (03/31 1310) Estimated body mass index is 30.68 kg/m as calculated from the following:   Height as of this encounter: 5\' 11"  (1.803 m).   Weight as of this encounter: 99.8 kg.   Intake/Output from previous day: No intake/output data recorded. Intake/Output this shift: Total I/O In: 1000 [I.V.:1000] Out: 1575 [Urine:1425; Blood:150]  Physical exam the patient is somnolent but arousable.  His pupils are equal.  He is moving all 4 extremities well.  He follows commands.  He is not speaking yet.  His dressing is clean and dry.  Lab Results: Recent Labs    03/23/21 1330 03/23/21 2105  WBC 14.6*  --   HGB 15.2 12.9*  HCT 45.2 38.0*  PLT 257  --    BMET Recent Labs    03/23/21 2105  NA 140  K 3.8    Studies/Results: No results found.  Assessment/Plan: Status post resection of recurrent glioblastoma: The patient seem to be doing well.  I spoke with his wife.  LOS: 0 days     Ophelia Charter 03/23/2021, 10:54 PM

## 2021-03-23 NOTE — Op Note (Signed)
Brief history: The patient is a 55 year old white male who presented with aphasia in June.  He was found to have a left brain tumor.  I performed a left frontal craniotomy for gross total resection of the tumor, which turned out to be a glioblastoma, on 06/29/2020.  The patient was treated with radiation therapy and chemotherapy and did well until recently when a surveillance CT scan demonstrated a large recurrence of his left frontal tumor.  I discussed the various treatment options with the patient and his wife including surgery.  They have weighed the risks, benefits and alternatives and decided to proceed with a redo left craniotomy for resection/debulking of a glioblastoma.  Preop diagnosis: Recurrent glioblastoma  Postop diagnosis: The same  Procedure: Left frontal craniotomy for gross total resection of glioblastoma using microdissection using BrainLab stereotactic navigation  Surgeon: Dr. Earle Gell  Assistant: Dr. Pieter Partridge Dawley  Anesthesia: General endotracheal  Estimated blood loss: 150 cc  Specimens: Tumor  Drains: 1 subtemporal Jackson-Pratt drain  Complications: None  Description of procedure: The patient was brought to the operating room by the anesthesia team.  General endotracheal anesthesia was induced.  I applied the [place] three-point headrest to the patient's calvarium.  The patient's head was kept in the neutral position.  We entered the surface accordance to the [place] computer.  The patient's left scalp was then shaved with clippers and prepared with Betadine scrub and Betadine solution.  Sterile drapes were applied.  I injected the area to be incised with Marcaine with epinephrine solution.  I's a scalpel to incise through the patient's previous craniotomy scar.  We used Raney clips for wound edge hemostasis.  We exposed the underlying calvarium with electrocautery and the periosteal elevators.  This exposed the old craniotomy flap.  I inserted a cerebellar retractor  for exposure.  I remove the screws from the old craniotomy flap and then elevated the craniotomy flap.  I incised the underlying dura in a cruciate fashion.  We tacked up the dural edges with 4-0 Nurolon sutures.  We confirmed the tumor's location with the BrainLab neuro navigation.  I then used bipolar cautery and suction to create a corticotomy over the tumor.  We quickly entered into the tumor.  We obtain specimens for the pathologist.  The tumor was not particularly well-circumscribed.  Under the illumination and magnification of the microscope, we used the [place] to debulk the tumor internally and then resected the edges of the tumor.  We did make a small entry into the ventricle.  We removed all the tumor we could see.  We confirmed the good resection cavity using the BrainLab neuro navigation.  We then obtained hemostasis using bipolar cautery and Avitene.  We lined the tumor resection cavity with Surgicel.  We reapproximated the patient's dura with 4-0 Nurolon suture.  We covered the exposed dura with Gelfoam.  We then replaced the craniotomy flap with titanium mini plates and screws.  We placed a 10 mm flat Jackson-Pratt drain beneath the scalp and tunneled it out through a separate stab wound.  We then remove the retractor and reapproximated the patient's galea with interrupted 2-0 Vicryl suture.  We reapproximated the skin with stainless steel staples.  A sterile dressing was applied.  The drapes were removed.  I then removed the [place] three-point headrest from the patient's calvarium.  By report all sponge, instrument, and needle counts were correct at the end this case.

## 2021-03-23 NOTE — Anesthesia Preprocedure Evaluation (Signed)
Anesthesia Evaluation  Patient identified by MRN, date of birth, ID band Patient awake    Reviewed: Allergy & Precautions, H&P , NPO status , Patient's Chart, lab work & pertinent test results  Airway Mallampati: II  TM Distance: >3 FB Neck ROM: Full    Dental no notable dental hx. (+) Teeth Intact, Dental Advisory Given   Pulmonary neg pulmonary ROS,    Pulmonary exam normal breath sounds clear to auscultation       Cardiovascular negative cardio ROS   Rhythm:Regular Rate:Normal     Neuro/Psych Depression Brain Tumor Aphasia    GI/Hepatic negative GI ROS, Neg liver ROS,   Endo/Other  negative endocrine ROS  Renal/GU negative Renal ROS  negative genitourinary   Musculoskeletal  (+) Arthritis , Osteoarthritis,    Abdominal   Peds  Hematology negative hematology ROS (+)   Anesthesia Other Findings   Reproductive/Obstetrics negative OB ROS                             Anesthesia Physical  Anesthesia Plan  ASA: III  Anesthesia Plan: General   Post-op Pain Management:    Induction: Intravenous  PONV Risk Score and Plan: 3 and Ondansetron, Dexamethasone and Midazolam  Airway Management Planned: Oral ETT  Additional Equipment: Arterial line  Intra-op Plan:   Post-operative Plan: Extubation in OR  Informed Consent: I have reviewed the patients History and Physical, chart, labs and discussed the procedure including the risks, benefits and alternatives for the proposed anesthesia with the patient or authorized representative who has indicated his/her understanding and acceptance.     Dental advisory given  Plan Discussed with: CRNA  Anesthesia Plan Comments:         Anesthesia Quick Evaluation

## 2021-03-23 NOTE — Anesthesia Procedure Notes (Signed)
Arterial Line Insertion Start/End3/31/2022 2:00 PM Performed by: Janace Litten, CRNA, CRNA  Patient location: Pre-op. Preanesthetic checklist: patient identified, IV checked, risks and benefits discussed, surgical consent and monitors and equipment checked Lidocaine 1% used for infiltration Right, radial was placed Catheter size: 20 G Hand hygiene performed  and maximum sterile barriers used   Attempts: 1 Procedure performed without using ultrasound guided technique. Following insertion, dressing applied and Biopatch. Post procedure assessment: normal  Patient tolerated the procedure well with no immediate complications.

## 2021-03-24 ENCOUNTER — Encounter (HOSPITAL_COMMUNITY): Payer: Self-pay | Admitting: Neurosurgery

## 2021-03-24 LAB — MRSA PCR SCREENING: MRSA by PCR: NEGATIVE

## 2021-03-24 MED ORDER — CHLORHEXIDINE GLUCONATE CLOTH 2 % EX PADS
6.0000 | MEDICATED_PAD | Freq: Every day | CUTANEOUS | Status: DC
  Administered 2021-03-25: 6 via TOPICAL

## 2021-03-24 MED ORDER — LEVETIRACETAM IN NACL 500 MG/100ML IV SOLN
500.0000 mg | Freq: Two times a day (BID) | INTRAVENOUS | Status: DC
  Administered 2021-03-24 – 2021-03-25 (×5): 500 mg via INTRAVENOUS
  Filled 2021-03-24 (×5): qty 100

## 2021-03-24 MED FILL — Thrombin For Soln Kit 20000 Unit: CUTANEOUS | Qty: 1 | Status: AC

## 2021-03-24 NOTE — Progress Notes (Signed)
While in PACU, patient had two episodes of nausea and vomiting.  Dr. Lissa Hoard made aware.  10mg  of IV Barhemsys given for first occurrence with minimal relief.  6.25 mg IV Phernergren given for second episode with moderate relief of symptoms.  On call surgeon, Dr. Reatha Armour made aware.  No new orders received.  Patient remained hemodynamically stable while in PACU and was able to move all extremities and follow all commands.  Patient transported to neuro ICU with no complications.

## 2021-03-24 NOTE — Progress Notes (Signed)
Patient nauseous, not able to tolerate PO medications. Dr. Reatha Armour aware, Keppra changed to IV.

## 2021-03-24 NOTE — Progress Notes (Signed)
Report from PACU- Dr. Arnoldo Morale would like SBP<160.

## 2021-03-24 NOTE — Progress Notes (Signed)
Prior to removing foley catheter, noted that output increased to 1050 ml. Notified Dr. Arnoldo Morale who instructed to monitor output closely. If urine output decreases through the shift, remove foley. Otherwise if urine output is still high, continue to monitor closely and will reassess the need for the foley tomorrow morning.

## 2021-03-24 NOTE — Progress Notes (Signed)
Subjective: The patient is alert and pleasant.  He is nauseated.  Objective: Vital signs in last 24 hours: Temp:  [97.9 F (36.6 C)-98.5 F (36.9 C)] 98.5 F (36.9 C) (04/01 0400) Pulse Rate:  [50-79] 74 (04/01 0600) Resp:  [14-35] 21 (04/01 0600) BP: (136-156)/(88-98) 148/94 (04/01 0600) SpO2:  [92 %-99 %] 99 % (04/01 0600) Arterial Line BP: (95-197)/(84-144) 99/84 (04/01 0600) Weight:  [99.8 kg] 99.8 kg (03/31 1310) Estimated body mass index is 30.68 kg/m as calculated from the following:   Height as of this encounter: 5\' 11"  (1.803 m).   Weight as of this encounter: 99.8 kg.   Intake/Output from previous day: 03/31 0701 - 04/01 0700 In: 1626 [I.V.:1426; IV Piggyback:200] Out: 3132 [Urine:2975; Drains:7; Blood:150] Intake/Output this shift: Total I/O In: 1626 [I.V.:1426; IV Piggyback:200] Out: 3132 [Urine:2975; Drains:7; Blood:150]  Physical exam patient is alert and oriented.  His speech seems normal.  His strength is normal.  His dressing has a small old bloodstain.  Lab Results: Recent Labs    03/23/21 1330 03/23/21 2105  WBC 14.6*  --   HGB 15.2 12.9*  HCT 45.2 38.0*  PLT 257  --    BMET Recent Labs    03/23/21 2105  NA 140  K 3.8    Studies/Results: No results found.  Assessment/Plan: Postop day #1: The patient is doing well except for his nausea.  Hopefully this will quickly pass.  I will plan to get a follow-up brain MRI tomorrow when he feels a bit better.  We will plan to discontinue his drain tomorrow as well.  LOS: 1 day     Ophelia Charter 03/24/2021, 6:50 AM

## 2021-03-24 NOTE — Anesthesia Postprocedure Evaluation (Signed)
Anesthesia Post Note  Patient: Robert Espinoza  Procedure(s) Performed: Stereotactic CRANIOTOMY for  TUMOR EXCISION, Left (N/A Head) APPLICATION OF CRANIAL NAVIGATION (N/A )     Patient location during evaluation: PACU Anesthesia Type: General Level of consciousness: sedated and patient cooperative Pain management: pain level controlled Vital Signs Assessment: post-procedure vital signs reviewed and stable Respiratory status: spontaneous breathing Cardiovascular status: stable Anesthetic complications: no   No complications documented.  Last Vitals:  Vitals:   03/23/21 2315 03/23/21 2330  BP: (!) 148/92 (!) 156/95  Pulse: 71 79  Resp: (!) 28 (!) 34  Temp:  36.7 C  SpO2: 97% 97%    Last Pain:  Vitals:   03/23/21 2300  TempSrc:   PainSc: [place]

## 2021-03-25 ENCOUNTER — Inpatient Hospital Stay (HOSPITAL_COMMUNITY): Payer: 59

## 2021-03-25 LAB — BASIC METABOLIC PANEL
Anion gap: 8 (ref 5–15)
BUN: 23 mg/dL — ABNORMAL HIGH (ref 6–20)
CO2: 26 mmol/L (ref 22–32)
Calcium: 10.2 mg/dL (ref 8.9–10.3)
Chloride: 99 mmol/L (ref 98–111)
Creatinine, Ser: 1.05 mg/dL (ref 0.61–1.24)
GFR, Estimated: >60 mL/min (ref >60)
Glucose, Bld: 148 mg/dL — ABNORMAL HIGH (ref 70–99)
Potassium: 4.5 mmol/L (ref 3.5–5.1)
Sodium: 133 mmol/L — ABNORMAL LOW (ref 135–145)

## 2021-03-25 MED ORDER — DIAZEPAM 5 MG PO TABS
5.0000 mg | ORAL_TABLET | Freq: Once | ORAL | Status: AC
  Administered 2021-03-25: 5 mg via ORAL
  Filled 2021-03-25: qty 1

## 2021-03-25 MED ORDER — GADOBUTROL 1 MMOL/ML IV SOLN
10.0000 mL | Freq: Once | INTRAVENOUS | Status: AC | PRN
  Administered 2021-03-25: 10 mL via INTRAVENOUS

## 2021-03-25 NOTE — Progress Notes (Signed)
Subjective: The patient looks and feels better today.  He denies pain.  Objective: Vital signs in last 24 hours: Temp:  [98.4 F (36.9 C)-99.9 F (37.7 C)] 98.5 F (36.9 C) (04/02 0400) Pulse Rate:  [51-83] 54 (04/02 0600) Resp:  [9-20] 12 (04/02 0600) BP: (117-145)/(80-102) 128/93 (04/02 0600) SpO2:  [91 %-97 %] 96 % (04/02 0600) Arterial Line BP: (96-182)/(76-89) 160/76 (04/01 1000) Estimated body mass index is 30.68 kg/m as calculated from the following:   Height as of this encounter: 5\' 11"  (1.803 m).   Weight as of this encounter: 99.8 kg.   Intake/Output from previous day: 04/01 0701 - 04/02 0700 In: 1221.5 [P.O.:480; I.V.:541.5; IV Piggyback:200] Out: 3300 [Urine:4350; Drains:55] Intake/Output this shift: No intake/output data recorded.  Physical exam patient is alert and oriented.  His speech is normal.  He has somewhat of a flat affect.  He is moving all 4 extremities well.  His pupils are equal.  His dressing has an old bloodstain.  I remove the drain.  Lab Results: Recent Labs    03/23/21 1330 03/23/21 2105  WBC 14.6*  --   HGB 15.2 12.9*  HCT 45.2 38.0*  PLT 257  --    BMET Recent Labs    03/23/21 2105  NA 140  K 3.8    Studies/Results: No results found.  Assessment/Plan: Postop day #2: The patient is doing well.  We will DC his Foley catheter.  I will get a postoperative brain MRI to check the extent of resection.  The patient had high urine output yesterday.  I assume this it is appropriate diuresis after fluid given to the OR.  But I will check a BMP to make sure his electrolytes are okay.  He will likely come tomorrow.    LOS: 2 days     Ophelia Charter 03/25/2021, 7:36 AM

## 2021-03-25 NOTE — Plan of Care (Signed)
Pt a/o X4, VS stable. C/o H/A only when ambulated in the room. Otherwise, denies increasing pain. Transferred to and from MRI. MD called and notified about stable procedure. Orders to transfer will be in

## 2021-03-26 MED ORDER — LEVETIRACETAM 500 MG PO TABS
500.0000 mg | ORAL_TABLET | Freq: Two times a day (BID) | ORAL | Status: DC
  Administered 2021-03-26: 500 mg via ORAL
  Filled 2021-03-26: qty 1

## 2021-03-26 MED ORDER — DEXAMETHASONE 4 MG PO TABS: 4.0000 mg | ORAL_TABLET | Freq: Three times a day (TID) | ORAL | 1 refills | Status: DC

## 2021-03-26 MED ORDER — LEVETIRACETAM 500 MG PO TABS: 500.0000 mg | ORAL_TABLET | Freq: Two times a day (BID) | ORAL | 3 refills | Status: DC

## 2021-03-26 MED ORDER — OXYCODONE-ACETAMINOPHEN 5-325 MG PO TABS: 1.0000 | ORAL_TABLET | Freq: Four times a day (QID) | ORAL | 0 refills | Status: DC | PRN

## 2021-03-26 NOTE — Discharge Summary (Signed)
Physician Discharge Summary  Patient ID: Robert Espinoza MRN: [id number] DOB/AGE: 56-Jul-1967 56 y.o.  Admit date: 03/23/2021 Discharge date: 03/26/2021  Admission Diagnoses: GBM    Discharge Diagnoses: same   Discharged Condition: stable  Hospital Course: The patient was admitted on 03/23/2021 and taken to the operating room where the patient underwent craniotomy for tumor. The patient tolerated the procedure well and was taken to the recovery room and then to the floor in stable condition. The hospital course was routine. There were no complications. The wound remained clean dry and intact. Pt had appropriate head soreness. No complaints of new N/T/W. The patient remained afebrile with stable vital signs, and tolerated a regular diet. The patient continued to increase activities, and pain was well controlled with oral pain medications.   Consults: None  Significant Diagnostic Studies:  Results for orders placed or performed during the hospital encounter of 03/23/21  SARS Coronavirus 2 by RT PCR (hospital order, performed in [place] hospital lab) Nasopharyngeal Nasopharyngeal Swab   Specimen: Nasopharyngeal Swab  Result Value Ref Range   SARS Coronavirus 2 NEGATIVE NEGATIVE  MRSA PCR Screening   Specimen: Nasal Mucosa; Nasopharyngeal  Result Value Ref Range   MRSA by PCR NEGATIVE NEGATIVE  CBC per protocol  Result Value Ref Range   WBC 14.6 (H) 4.0 - 10.5 K/uL   RBC 5.02 4.22 - 5.81 MIL/uL   Hemoglobin 15.2 13.0 - 17.0 g/dL   HCT 45.2 39.0 - 52.0 %   MCV 90.0 80.0 - 100.0 fL   MCH 30.3 26.0 - 34.0 pg   MCHC 33.6 30.0 - 36.0 g/dL   RDW 14.4 11.5 - 15.5 %   Platelets 257 150 - 400 K/uL   nRBC 0.0 0.0 - 0.2 %  Basic metabolic panel  Result Value Ref Range   Sodium 133 (L) 135 - 145 mmol/L   Potassium 4.5 3.5 - 5.1 mmol/L   Chloride 99 98 - 111 mmol/L   CO2 26 22 - 32 mmol/L   Glucose, Bld 148 (H) 70 - 99 mg/dL   BUN 23 (H) 6 - 20 mg/dL   Creatinine, Ser 1.05 0.61 - 1.24  mg/dL   Calcium 10.2 8.9 - 10.3 mg/dL   GFR, Estimated >60 >60 mL/min   Anion gap 8 5 - 15  I-STAT 7, (LYTES, BLD GAS, ICA, H+H)  Result Value Ref Range   pH, Arterial 7.379 7.350 - 7.450   pCO2 arterial 43.0 32.0 - 48.0 mmHg   pO2, Arterial 110 (H) 83.0 - 108.0 mmHg   Bicarbonate 25.4 20.0 - 28.0 mmol/L   TCO2 27 22 - 32 mmol/L   O2 Saturation 98.0 %   Acid-Base Excess 0.0 0.0 - 2.0 mmol/L   Sodium 140 135 - 145 mmol/L   Potassium 3.8 3.5 - 5.1 mmol/L   Calcium, Ion 1.33 1.15 - 1.40 mmol/L   HCT 38.0 (L) 39.0 - 52.0 %   Hemoglobin 12.9 (L) 13.0 - 17.0 g/dL   Sample type ARTERIAL   Type and screen [place]  Result Value Ref Range   ABO/RH(D) O POS    Antibody Screen NEG    Sample Expiration      03/26/2021,2359 Performed at [facility], [address], [place], [place] [address]     MR BRAIN W WO CONTRAST  Result Date: 03/25/2021 CLINICAL DATA:  Status post resection glioblastoma July 2021. Tumor recurrence. Status post repeat resection. Postoperative day 2. EXAM: MRI HEAD WITHOUT AND WITH  CONTRAST TECHNIQUE: Multiplanar, multiecho pulse sequences of the brain and surrounding structures were obtained without and with intravenous contrast. CONTRAST:  47mL GADAVIST GADOBUTROL 1 MMOL/ML IV SOLN COMPARISON:  MR head without and with contrast 03/06/2021 and 11/28/2020 FINDINGS: Brain: Left frontal craniotomy noted. Tumor mass resected. Blood products are present in the surgical cavity. No definite enhancement present. Surrounding vasogenic edema is present, improved. Mass effect is significantly decreased. No new areas of enhancement are present. The ventricles are of normal size. No significant extraaxial fluid collection is present. The internal auditory canals are within normal limits. The brainstem and cerebellum are within normal limits. Vascular: Flow is present in the major intracranial arteries. Skull and upper cervical spine: The craniocervical junction is  normal. Upper cervical spine is within normal limits. Marrow signal is unremarkable. Sinuses/Orbits: The paranasal sinuses and mastoid air cells are clear. The globes and orbits are within normal limits. IMPRESSION: 1. Postoperative changes of left frontal craniotomy and tumor resection. 2. No definite enhancement to suggest residual or recurrent tumor. 3. Blood products noted within the surgical cavity. 4. Decreased mass effect and surrounding vasogenic edema. Electronically Signed   By: San Morelle M.D.   On: 03/25/2021 19:08    Antibiotics:  Anti-infectives (From admission, onward)   Start     Dose/Rate Route Frequency Ordered Stop   03/24/21 0300  ceFAZolin (ANCEF) IVPB 2g/100 mL premix        2 g 200 mL/hr over 30 Minutes Intravenous Every 8 hours 03/23/21 2345 03/24/21 1154   03/23/21 1300  ceFAZolin (ANCEF) IVPB 2g/100 mL premix        2 g 200 mL/hr over 30 Minutes Intravenous On call to O.R. 03/23/21 1256 03/23/21 1915      Discharge Exam: Blood pressure 130/84, pulse (!) 50, temperature 97.9 F (36.6 C), temperature source Oral, resp. rate 18, height 5\' 11"  (1.803 m), weight 99.8 kg, SpO2 94 %. Neurologic: Grossly normal Incision cdi  Discharge Medications:   Allergies as of 03/26/2021   No Known Allergies     Medication List    TAKE these medications   dexamethasone 4 MG tablet Commonly known as: DECADRON Take 1 tablet (4 mg total) by mouth 3 (three) times daily. What changed:   when to take this  additional instructions   gabapentin 300 MG capsule Commonly known as: NEURONTIN Take 300 mg by mouth at bedtime.   gabapentin 100 MG capsule Commonly known as: NEURONTIN Take 100 mg by mouth in the morning.   levETIRAcetam 500 MG tablet Commonly known as: KEPPRA Take 1 tablet (500 mg total) by mouth 2 (two) times daily.   oxyCODONE-acetaminophen 5-325 MG tablet Commonly known as: PERCOCET/ROXICET Take 1-2 tablets by mouth every 6 (six) hours as needed  for moderate pain or severe pain (1 tablet for moderate pain and 1 tablet for severe pain). What changed: when to take this   pantoprazole 40 MG tablet Commonly known as: PROTONIX Take 1 tablet (40 mg total) by mouth 2 (two) times daily. What changed: when to take this   zolpidem 10 MG tablet Commonly known as: AMBIEN Take 10 mg by mouth at bedtime.       Disposition: home   Final Dx: crani for tumor  Discharge Instructions    Call MD for:  difficulty breathing, headache or visual disturbances   Complete by: As directed    Call MD for:  persistant nausea and vomiting   Complete by: As directed    Call MD  for:  redness, tenderness, or signs of infection (pain, swelling, redness, odor or green/yellow discharge around incision site)   Complete by: As directed    Call MD for:  severe uncontrolled pain   Complete by: As directed    Call MD for:  temperature >100.4   Complete by: As directed    Diet - low sodium heart healthy   Complete by: As directed    Increase activity slowly   Complete by: As directed    No wound care   Complete by: As directed          Signed: Eustace Moore 03/26/2021, 9:56 AM

## 2021-03-26 NOTE — Progress Notes (Signed)
Pt wheeled off unit by NT wife too belongings to the car prior to DC

## 2021-03-28 LAB — SURGICAL PATHOLOGY

## 2021-07-24 DEATH — deceased
